# Patient Record
Sex: Female | Born: 1945
Health system: Southern US, Community
[De-identification: ages and names within clinical notes are randomized; demographics above are authoritative.]

## PROBLEM LIST (undated history)

## (undated) DIAGNOSIS — N189 Chronic kidney disease, unspecified: Secondary | ICD-10-CM

## (undated) DIAGNOSIS — K219 Gastro-esophageal reflux disease without esophagitis: Secondary | ICD-10-CM

## (undated) DIAGNOSIS — Z9889 Other specified postprocedural states: Secondary | ICD-10-CM

## (undated) DIAGNOSIS — I10 Essential (primary) hypertension: Secondary | ICD-10-CM

## (undated) DIAGNOSIS — M1990 Unspecified osteoarthritis, unspecified site: Secondary | ICD-10-CM

## (undated) DIAGNOSIS — R011 Cardiac murmur, unspecified: Secondary | ICD-10-CM

## (undated) DIAGNOSIS — R112 Nausea with vomiting, unspecified: Secondary | ICD-10-CM

## (undated) DIAGNOSIS — E785 Hyperlipidemia, unspecified: Secondary | ICD-10-CM

## (undated) HISTORY — PX: TONSILLECTOMY: SUR1361

## (undated) HISTORY — DX: Cardiac murmur, unspecified: R01.1

## (undated) HISTORY — DX: Unspecified osteoarthritis, unspecified site: M19.90

## (undated) HISTORY — DX: Hyperlipidemia, unspecified: E78.5

## (undated) HISTORY — PX: COLONOSCOPY: SHX174

## (undated) HISTORY — PX: ABDOMINAL HYSTERECTOMY: SHX81

## (undated) HISTORY — PX: DILATION AND CURETTAGE OF UTERUS: SHX78

## (undated) HISTORY — PX: UPPER GI ENDOSCOPY: SHX6162

---

## 1997-08-25 ENCOUNTER — Ambulatory Visit: Admission: RE | Admit: 1997-08-25 | Discharge: 1997-08-25 | Payer: Self-pay | Admitting: *Deleted

## 1997-08-25 ENCOUNTER — Ambulatory Visit (HOSPITAL_COMMUNITY): Admission: RE | Admit: 1997-08-25 | Discharge: 1997-08-25 | Payer: Self-pay | Admitting: *Deleted

## 1999-09-15 ENCOUNTER — Ambulatory Visit (HOSPITAL_COMMUNITY): Admission: RE | Admit: 1999-09-15 | Discharge: 1999-09-15 | Payer: Self-pay | Admitting: Family Medicine

## 2000-03-26 ENCOUNTER — Encounter: Payer: Self-pay | Admitting: General Surgery

## 2000-03-26 ENCOUNTER — Encounter: Admission: RE | Admit: 2000-03-26 | Discharge: 2000-03-26 | Payer: Self-pay | Admitting: General Surgery

## 2000-03-26 ENCOUNTER — Ambulatory Visit (HOSPITAL_COMMUNITY): Admission: RE | Admit: 2000-03-26 | Discharge: 2000-03-26 | Payer: Self-pay | Admitting: General Surgery

## 2000-09-17 ENCOUNTER — Encounter: Payer: Self-pay | Admitting: Urology

## 2000-09-17 ENCOUNTER — Ambulatory Visit (HOSPITAL_COMMUNITY): Admission: RE | Admit: 2000-09-17 | Discharge: 2000-09-17 | Payer: Self-pay | Admitting: Urology

## 2010-03-23 ENCOUNTER — Encounter: Admission: RE | Admit: 2010-03-23 | Discharge: 2010-03-23 | Payer: Self-pay | Admitting: Gastroenterology

## 2010-10-12 NOTE — Op Note (Signed)
[facility]  Patient:    Elizabeth Garrett, Elizabeth Garrett                   MRN: [id number] Proc. Date: 09/17/00 Adm. Date:  [id number] Attending:  Katherine Roan                           Operative Report  PREOPERATIVE DIAGNOSIS:  Large distal right ureteral stone.  POSTOPERATIVE DIAGNOSIS:  Large distal right ureteral stone.  OPERATION:  Ureteroscopy stone extraction with insertion of stent.  SURGEON:  Rozanna Boer., M.D.  ANESTHESIA:  General.  BRIEF HISTORY:  This 65 year old white female from [place] entered with acute right flank pain since September 14, 2000.  She had an 8 mm stone over the sacrum at that time, first seen on September 14, 2000, in the emergency room.  She has had two previous stones, one in 1997 that was removed by cystoscopic techniques in [place], and then another one passed spontaneously in 1995.  I saw her in the office and gave her a shot of Toradol, gave her some pain medicine which she not used in the last 36 hours.  KUB shows that the stone has now moved down to the distal right ureter just a few centimeters from the UV junction.  The patient enters now for cystoscopic removal of this large stone which is not likely to pass.  DESCRIPTION OF PROCEDURE:  The patient was placed on the operating table in the dorsolithotomy position after satisfactory induction of general endotracheal anesthesia. She was prepped and draped with Betadine in the usual sterile fashion.  The #21 Panendoscope was inserted, the bladder drained.  The bladder was smooth, not trabeculated, and no bladder mucosal lesions.  The right orifice was catheterized with a 0.38 floppy-tipped guidewire which, under fluoroscopy, was passed beyond the stone without difficulty.  Over the guidewire, a 4 cm ureteral balloon dilator was inserted and inflated to 14 atmospheres for 5 timed minutes.  The balloon dilator was then removed, and the guidewire was left for  a safety wire.  A 6 short ureteroscope was then passed up to and beyond the stone, and the stone was grasped with a four-wire basket and removed intact.  It was little tight coming through the orifice, but it came through intact.  A 6-French 24 cm length JJ ureteral stent was then passed over the guidewire under fluoroscopy.  As the guidewire was removed, the stent was in good position, the bladder drained.  A string was left tied to the distal end which was brought out through the urethra and taped to her lower abdomen.  She was given a shot of Toradol and was taken to the recovery room in good condition.  She will later be discharged as an outpatient.DD:  09/17/00 TD:  09/18/00 Job: [id number]

## 2013-02-18 ENCOUNTER — Other Ambulatory Visit: Payer: Self-pay | Admitting: Gastroenterology

## 2013-02-18 DIAGNOSIS — R112 Nausea with vomiting, unspecified: Secondary | ICD-10-CM

## 2013-03-02 ENCOUNTER — Ambulatory Visit
Admission: RE | Admit: 2013-03-02 | Discharge: 2013-03-02 | Disposition: A | Discharge status: Home / Self Care | Payer: Medicare Other | Source: Ambulatory Visit | Attending: Gastroenterology | Admitting: Gastroenterology

## 2013-03-02 DIAGNOSIS — R112 Nausea with vomiting, unspecified: Secondary | ICD-10-CM

## 2013-04-09 ENCOUNTER — Ambulatory Visit (INDEPENDENT_AMBULATORY_CARE_PROVIDER_SITE_OTHER): Payer: Self-pay | Admitting: General Surgery

## 2013-04-09 ENCOUNTER — Encounter (INDEPENDENT_AMBULATORY_CARE_PROVIDER_SITE_OTHER): Payer: Self-pay | Admitting: General Surgery

## 2013-04-09 ENCOUNTER — Ambulatory Visit (INDEPENDENT_AMBULATORY_CARE_PROVIDER_SITE_OTHER): Payer: Medicare Other | Admitting: General Surgery

## 2013-04-09 ENCOUNTER — Encounter (INDEPENDENT_AMBULATORY_CARE_PROVIDER_SITE_OTHER): Payer: Self-pay

## 2013-04-09 VITALS — BP 118/60 | HR 64 | Temp 97.2°F | Resp 14 | Ht 63.5 in | Wt 150.8 lb

## 2013-04-09 DIAGNOSIS — K8020 Calculus of gallbladder without cholecystitis without obstruction: Secondary | ICD-10-CM | POA: Insufficient documentation

## 2013-04-09 NOTE — Progress Notes (Signed)
Patient ID: Elizabeth Garrett, female   DOB: 05/27/1945, 67 y.o.   MRN: [id number]  Chief Complaint  Patient presents with  . New Evaluation    eval GB    HPI Elizabeth Garrett is a 67 y.o. female.  We're asked to the patient in consultation by Dr. Ross to evaluate her for gallstones. The patient is a 67 her white female who has been experiencing episodes of nausea vomiting and diarrhea. This is been going on for the last year or 2. He symptoms occur about one to 2 times per month. She has experienced right upper quadrant pain as well as pain all over her abdomen with this. She has had upper and lower endoscopies which were negative. She had an ultrasound which did show stones in her gallbladder but no gallbladder wall thickening or ductal dilatation. She was also told that she has hemorrhoids but she denies any rectal pain or rectal bleeding  HPI  Past Medical History  Diagnosis Date  . Heart murmur   . Arthritis   . Hyperlipidemia     Past Surgical History  Procedure Laterality Date  . Abdominal hysterectomy      History reviewed. No pertinent family history.  Social History History  Substance Use Topics  . Smoking status: Never Smoker   . Smokeless tobacco: Never Used  . Alcohol Use: No    Allergies  Allergen Reactions  . Sulfa Antibiotics Hives    Current Outpatient Prescriptions  Medication Sig Dispense Refill  . Misc Natural Products (OSTEO BI-FLEX ADV DOUBLE ST PO) Take by mouth.      . Multiple Vitamins-Minerals (CENTRUM SILVER ADULT 50+ PO) Take by mouth.      . Vitamin D, Ergocalciferol, (DRISDOL) 50000 UNITS CAPS capsule Take 50,000 Units by mouth every 7 (seven) days.      . amitriptyline (ELAVIL) 10 MG tablet       . benazepril (LOTENSIN) 40 MG tablet       . diclofenac (VOLTAREN) 75 MG EC tablet       . estradiol (ESTRACE) 0.5 MG tablet       . hydrochlorothiazide (HYDRODIURIL) 25 MG tablet        No current facility-administered medications for this  visit.    Review of Systems Review of Systems  Constitutional: Negative.   HENT: Negative.   Eyes: Negative.   Respiratory: Negative.   Cardiovascular: Negative.   Gastrointestinal: Positive for nausea, vomiting, abdominal pain, diarrhea and abdominal distention.  Endocrine: Negative.   Genitourinary: Negative.   Musculoskeletal: Negative.   Skin: Negative.   Allergic/Immunologic: Negative.   Neurological: Negative.   Hematological: Negative.   Psychiatric/Behavioral: Negative.     Blood pressure 118/60, pulse 64, temperature 97.2 F (36.2 C), temperature source Temporal, resp. rate 14, height 5' 3.5" (1.613 m), weight 150 lb 12.8 oz (68.402 kg).  Physical Exam Physical Exam  Constitutional: She is oriented to person, place, and time. She appears well-developed and well-nourished.  HENT:  Head: Normocephalic and atraumatic.  Eyes: Conjunctivae and EOM are normal. Pupils are equal, round, and reactive to light.  Neck: Normal range of motion. Neck supple.  Cardiovascular: Normal rate, regular rhythm and normal heart sounds.   Pulmonary/Chest: Effort normal and breath sounds normal.  Abdominal: Soft. Bowel sounds are normal. She exhibits no mass. There is no tenderness.  Genitourinary:  On rectal exam she has a small soft floppy external hemorrhoidal skin tag anteriorly. She has good rectal tone. There is no palpable   mass on digital exam.  Musculoskeletal: Normal range of motion.  Neurological: She is alert and oriented to person, place, and time.  Skin: Skin is warm and dry.  Psychiatric: She has a normal mood and affect. Her behavior is normal.    Data Reviewed As above  Assessment    The patient appears to have symptomatic gallstones although some of her pain is atypical. I've discussed with her in detail the risks and benefits of the operation to remove the gallbladder as well as some of the technical aspects and she understands and wishes to proceed. She understands  that I cannot guarantee that all of her painful goal weight once the gallbladder is removed. I also do not think her hemorrhoid is causing her any significant symptoms and probably does not need surgical intervention     Plan    Plan for laparoscopic cholecystectomy with intraoperative cholangiogram        TOTH III,Mayrin Schmuck S 04/09/2013, 2:36 PM    

## 2013-04-13 ENCOUNTER — Ambulatory Visit (INDEPENDENT_AMBULATORY_CARE_PROVIDER_SITE_OTHER): Payer: Medicare Other | Admitting: General Surgery

## 2013-04-19 ENCOUNTER — Encounter (HOSPITAL_COMMUNITY): Payer: Self-pay | Admitting: Pharmacy Technician

## 2013-04-27 ENCOUNTER — Telehealth (INDEPENDENT_AMBULATORY_CARE_PROVIDER_SITE_OTHER): Payer: Self-pay | Admitting: General Surgery

## 2013-04-27 NOTE — Telephone Encounter (Signed)
Called pt back. ABX will be given morning of surgery through IV at the hospital.

## 2013-04-27 NOTE — Telephone Encounter (Signed)
Pt called to ask about need (or not) for pre-op antibiotics.  She stated Dr. Carolynne Edouard had verbally discussed this at her last OV, but not given a Rx nor was one called in to her pharmacy.  Please advise.

## 2013-04-28 ENCOUNTER — Encounter (HOSPITAL_COMMUNITY)
Admission: RE | Admit: 2013-04-28 | Discharge: 2013-04-28 | Disposition: A | Discharge status: Home / Self Care | Payer: Medicare Other | Source: Ambulatory Visit | Attending: General Surgery | Admitting: General Surgery

## 2013-04-28 ENCOUNTER — Other Ambulatory Visit (HOSPITAL_COMMUNITY): Payer: Self-pay | Admitting: *Deleted

## 2013-04-28 ENCOUNTER — Encounter (HOSPITAL_COMMUNITY): Payer: Self-pay

## 2013-04-28 DIAGNOSIS — Z01812 Encounter for preprocedural laboratory examination: Secondary | ICD-10-CM | POA: Insufficient documentation

## 2013-04-28 DIAGNOSIS — Z01818 Encounter for other preprocedural examination: Secondary | ICD-10-CM | POA: Insufficient documentation

## 2013-04-28 DIAGNOSIS — Z01810 Encounter for preprocedural cardiovascular examination: Secondary | ICD-10-CM | POA: Insufficient documentation

## 2013-04-28 HISTORY — DX: Essential (primary) hypertension: I10

## 2013-04-28 HISTORY — DX: Gastro-esophageal reflux disease without esophagitis: K21.9

## 2013-04-28 HISTORY — DX: Other specified postprocedural states: Z98.890

## 2013-04-28 HISTORY — DX: Chronic kidney disease, unspecified: N18.9

## 2013-04-28 HISTORY — DX: Nausea with vomiting, unspecified: R11.2

## 2013-04-28 LAB — COMPREHENSIVE METABOLIC PANEL
ALT: 21 U/L (ref 0–35)
AST: 21 U/L (ref 0–37)
Albumin: 3.5 g/dL (ref 3.5–5.2)
CO2: 28 mEq/L (ref 19–32)
Chloride: 102 mEq/L (ref 96–112)
Creatinine, Ser: 1.07 mg/dL (ref 0.50–1.10)
GFR calc non Af Amer: 53 mL/min — ABNORMAL LOW (ref >90)
Potassium: 4.3 mEq/L (ref 3.5–5.1)
Sodium: 140 mEq/L (ref 135–145)
Total Bilirubin: 0.2 mg/dL — ABNORMAL LOW (ref 0.3–1.2)
Total Protein: 7 g/dL (ref 6.0–8.3)

## 2013-04-28 LAB — CBC WITH DIFFERENTIAL/PLATELET
Basophils Absolute: 0 10*3/uL (ref 0.0–0.1)
Basophils Relative: 0 % (ref 0–1)
Eosinophils Relative: 3 % (ref 0–5)
Lymphocytes Relative: 31 % (ref 12–46)
Lymphs Abs: 2.8 10*3/uL (ref 0.7–4.0)
MCHC: 33.1 g/dL (ref 30.0–36.0)
MCV: 86 fL (ref 78.0–100.0)
Monocytes Absolute: 0.5 10*3/uL (ref 0.1–1.0)
Monocytes Relative: 5 % (ref 3–12)
Neutro Abs: 5.6 10*3/uL (ref 1.7–7.7)
Neutrophils Relative %: 61 % (ref 43–77)
Platelets: 315 10*3/uL (ref 150–400)
RDW: 14.4 % (ref 11.5–15.5)
WBC: 9.2 10*3/uL (ref 4.0–10.5)

## 2013-04-28 MED ORDER — CHLORHEXIDINE GLUCONATE 4 % EX LIQD: 1.0000 "application " | Freq: Once | CUTANEOUS | Status: DC

## 2013-04-28 NOTE — Pre-Procedure Instructions (Signed)
Elizabeth Garrett  04/28/2013   Your procedure is scheduled on:  Monday, May 03, 2013 at 12:00 PM.   Report to [facility] Entrance "A" at 10:00 AM.   Call this number if you have problems the morning of surgery: [phone]   Remember:   Do not eat food or drink liquids after midnight Sunday, 05/02/13.   Take these medicines the morning of surgery with A SIP OF WATER: estradiol (ESTRACE), Polyethyl Glycol-Propyl Glycol (SYSTANE OP) eye-drops if needed, acetaminophen (TYLENOL) - if needed.   Stop all Vitamins, Herbal Medications, Aspirin and NSAIDS - diclofenac (VOLTAREN) as of today.                Do not wear jewelry, make-up or nail polish.  Do not wear lotions, powders, or perfumes. You may wear deodorant.  Do not shave 48 hours prior to surgery.   Do not bring valuables to the hospital.  [facility] is not responsible                  for any belongings or valuables.               Contacts, dentures or bridgework may not be worn into surgery.  Leave suitcase in the car. After surgery it may be brought to your room.  For patients admitted to the hospital, discharge time is determined by your                treatment team.                Special Instructions: Shower using CHG 2 nights before surgery and the night before surgery.  If you shower the day of surgery use CHG.  Use special wash - you have one bottle of CHG for all showers.  You should use approximately 1/3 of the bottle for each shower.   Please read over the following fact sheets that you were given: Pain Booklet, Coughing and Deep Breathing and Surgical Site Infection Prevention

## 2013-04-29 NOTE — Progress Notes (Signed)
Anesthesia Chart Review:  Patient is a 67 year old female scheduled for laparoscopic cholecystectomy on 05/03/13 by Dr. Carolynne Edouard.  History includes non-smoker, post-operative N/V, HLD, GERD, HTN, heart murmur (not specified), nephrolithiasis, arthritis, tonsillectomy, hysterectomy. PCP is Dr. Vianne Bulls.  EKG on NSR, LVH, LAE, non-specificiST/T wave abnormality. Overall, I think it is stable when compared to prior EKG on 09/17/00.  Preoperative CXR and labs noted.  Further evaluation by her assigned anesthesiologist on the day of surgery.  If no acute changes then I would anticipate that she could proceed as planned.  Velna Ochs [facility] Short Stay Center/Anesthesiology Phone [phone] 04/29/2013 1:06 PM

## 2013-05-02 MED ORDER — CEFAZOLIN SODIUM-DEXTROSE 2-3 GM-% IV SOLR
2.0000 g | INTRAVENOUS | Status: AC
  Administered 2013-05-03: 2 g via INTRAVENOUS
  Filled 2013-05-02: qty 50

## 2013-05-03 ENCOUNTER — Ambulatory Visit (HOSPITAL_COMMUNITY): Payer: Medicare Other

## 2013-05-03 ENCOUNTER — Ambulatory Visit (HOSPITAL_COMMUNITY): Payer: Medicare Other | Admitting: Anesthesiology

## 2013-05-03 ENCOUNTER — Ambulatory Visit (HOSPITAL_COMMUNITY)
Admission: RE | Admit: 2013-05-03 | Discharge: 2013-05-03 | Disposition: A | Discharge status: Home / Self Care | Payer: Medicare Other | Source: Ambulatory Visit | Attending: General Surgery | Admitting: General Surgery

## 2013-05-03 ENCOUNTER — Encounter (HOSPITAL_COMMUNITY): Payer: Self-pay | Admitting: Anesthesiology

## 2013-05-03 ENCOUNTER — Encounter (HOSPITAL_COMMUNITY): Payer: Medicare Other | Admitting: Vascular Surgery

## 2013-05-03 ENCOUNTER — Encounter (HOSPITAL_COMMUNITY)
Admission: RE | Disposition: A | Discharge status: Home / Self Care | Payer: Self-pay | Source: Ambulatory Visit | Attending: General Surgery

## 2013-05-03 DIAGNOSIS — K219 Gastro-esophageal reflux disease without esophagitis: Secondary | ICD-10-CM | POA: Insufficient documentation

## 2013-05-03 DIAGNOSIS — K8010 Calculus of gallbladder with chronic cholecystitis without obstruction: Secondary | ICD-10-CM

## 2013-05-03 DIAGNOSIS — I10 Essential (primary) hypertension: Secondary | ICD-10-CM | POA: Insufficient documentation

## 2013-05-03 DIAGNOSIS — M129 Arthropathy, unspecified: Secondary | ICD-10-CM | POA: Insufficient documentation

## 2013-05-03 DIAGNOSIS — K8020 Calculus of gallbladder without cholecystitis without obstruction: Secondary | ICD-10-CM

## 2013-05-03 DIAGNOSIS — K824 Cholesterolosis of gallbladder: Secondary | ICD-10-CM

## 2013-05-03 HISTORY — PX: CHOLECYSTECTOMY: SHX55

## 2013-05-03 SURGERY — LAPAROSCOPIC CHOLECYSTECTOMY WITH INTRAOPERATIVE CHOLANGIOGRAM
Anesthesia: General | Site: Abdomen

## 2013-05-03 MED ORDER — DEXAMETHASONE SODIUM PHOSPHATE 4 MG/ML IJ SOLN
INTRAMUSCULAR | Status: DC | PRN
  Administered 2013-05-03: 8 mg via INTRAVENOUS

## 2013-05-03 MED ORDER — BUPIVACAINE-EPINEPHRINE 0.25% -1:200000 IJ SOLN
INTRAMUSCULAR | Status: DC | PRN
  Administered 2013-05-03: 25 mL

## 2013-05-03 MED ORDER — LABETALOL HCL 5 MG/ML IV SOLN
INTRAVENOUS | Status: DC | PRN
  Administered 2013-05-03 (×2): 10 mg via INTRAVENOUS

## 2013-05-03 MED ORDER — CHLORHEXIDINE GLUCONATE 4 % EX LIQD: 1.0000 "application " | Freq: Once | CUTANEOUS | Status: DC

## 2013-05-03 MED ORDER — HYDROMORPHONE HCL PF 1 MG/ML IJ SOLN
INTRAMUSCULAR | Status: AC
  Filled 2013-05-03: qty 1

## 2013-05-03 MED ORDER — PROPOFOL 10 MG/ML IV BOLUS
INTRAVENOUS | Status: DC | PRN
  Administered 2013-05-03: 150 mg via INTRAVENOUS

## 2013-05-03 MED ORDER — PROMETHAZINE HCL 25 MG/ML IJ SOLN
6.2500 mg | INTRAMUSCULAR | Status: DC | PRN
  Administered 2013-05-03: 6.25 mg via INTRAVENOUS

## 2013-05-03 MED ORDER — MIDAZOLAM HCL 5 MG/5ML IJ SOLN
INTRAMUSCULAR | Status: DC | PRN
  Administered 2013-05-03: 1 mg via INTRAVENOUS

## 2013-05-03 MED ORDER — HYDROMORPHONE HCL PF 1 MG/ML IJ SOLN
0.2500 mg | INTRAMUSCULAR | Status: DC | PRN
  Administered 2013-05-03: 0.5 mg via INTRAVENOUS
  Administered 2013-05-03 (×2): 0.25 mg via INTRAVENOUS

## 2013-05-03 MED ORDER — LIDOCAINE HCL (CARDIAC) 20 MG/ML IV SOLN
INTRAVENOUS | Status: DC | PRN
  Administered 2013-05-03: 80 mg via INTRAVENOUS

## 2013-05-03 MED ORDER — ONDANSETRON HCL 4 MG/2ML IJ SOLN
INTRAMUSCULAR | Status: DC | PRN
  Administered 2013-05-03: 4 mg via INTRAVENOUS

## 2013-05-03 MED ORDER — OXYCODONE HCL 5 MG PO TABS: 5.0000 mg | ORAL_TABLET | Freq: Once | ORAL | Status: DC | PRN

## 2013-05-03 MED ORDER — ROCURONIUM BROMIDE 100 MG/10ML IV SOLN
INTRAVENOUS | Status: DC | PRN
  Administered 2013-05-03: 50 mg via INTRAVENOUS

## 2013-05-03 MED ORDER — SODIUM CHLORIDE 0.9 % IR SOLN
Status: DC | PRN
  Administered 2013-05-03: 1000 mL

## 2013-05-03 MED ORDER — SODIUM CHLORIDE 0.9 % IV SOLN
INTRAVENOUS | Status: DC | PRN
  Administered 2013-05-03: 12:00:00

## 2013-05-03 MED ORDER — NEOSTIGMINE METHYLSULFATE 1 MG/ML IJ SOLN
INTRAMUSCULAR | Status: DC | PRN
  Administered 2013-05-03: 3 mg via INTRAVENOUS

## 2013-05-03 MED ORDER — OXYCODONE-ACETAMINOPHEN 5-325 MG PO TABS: 1.0000 | ORAL_TABLET | ORAL | Status: DC | PRN

## 2013-05-03 MED ORDER — GLYCOPYRROLATE 0.2 MG/ML IJ SOLN
INTRAMUSCULAR | Status: DC | PRN
  Administered 2013-05-03: 0.4 mg via INTRAVENOUS

## 2013-05-03 MED ORDER — LACTATED RINGERS IV SOLN
INTRAVENOUS | Status: DC | PRN
  Administered 2013-05-03 (×2): via INTRAVENOUS

## 2013-05-03 MED ORDER — OXYCODONE HCL 5 MG/5ML PO SOLN: 5.0000 mg | Freq: Once | ORAL | Status: DC | PRN

## 2013-05-03 MED ORDER — PROMETHAZINE HCL 25 MG/ML IJ SOLN
INTRAMUSCULAR | Status: AC
  Filled 2013-05-03: qty 1

## 2013-05-03 MED ORDER — LACTATED RINGERS IV SOLN
INTRAVENOUS | Status: DC
  Administered 2013-05-03: 10:00:00 via INTRAVENOUS

## 2013-05-03 MED ORDER — FENTANYL CITRATE 0.05 MG/ML IJ SOLN
INTRAMUSCULAR | Status: DC | PRN
  Administered 2013-05-03: 100 ug via INTRAVENOUS
  Administered 2013-05-03 (×3): 50 ug via INTRAVENOUS

## 2013-05-03 SURGICAL SUPPLY — 40 items
ADH SKN CLS APL DERMABOND .7 (GAUZE/BANDAGES/DRESSINGS) ×1
APPLIER CLIP ROT 10 11.4 M/L (STAPLE) ×2
APR CLP MED LRG 11.4X10 (STAPLE) ×1
BAG SPEC RTRVL LRG 6X4 10 (ENDOMECHANICALS) ×1
BLADE SURG ROTATE 9660 (MISCELLANEOUS) IMPLANT
CANISTER SUCTION 2500CC (MISCELLANEOUS) ×2 IMPLANT
CATH REDDICK CHOLANGI 4FR 50CM (CATHETERS) ×2 IMPLANT
CHLORAPREP W/TINT 26ML (MISCELLANEOUS) ×2 IMPLANT
CLIP APPLIE ROT 10 11.4 M/L (STAPLE) ×1 IMPLANT
COVER MAYO STAND STRL (DRAPES) ×2 IMPLANT
COVER SURGICAL LIGHT HANDLE (MISCELLANEOUS) ×2 IMPLANT
DECANTER SPIKE VIAL GLASS SM (MISCELLANEOUS) ×4 IMPLANT
DERMABOND ADVANCED (GAUZE/BANDAGES/DRESSINGS) ×1
DERMABOND ADVANCED .7 DNX12 (GAUZE/BANDAGES/DRESSINGS) ×1 IMPLANT
DRAPE C-ARM 42X72 X-RAY (DRAPES) ×2 IMPLANT
DRAPE UTILITY 15X26 W/TAPE STR (DRAPE) ×4 IMPLANT
ELECT REM PT RETURN 9FT ADLT (ELECTROSURGICAL) ×2
ELECTRODE REM PT RTRN 9FT ADLT (ELECTROSURGICAL) ×1 IMPLANT
GLOVE BIO SURGEON STRL SZ7 (GLOVE) ×1 IMPLANT
GLOVE BIO SURGEON STRL SZ7.5 (GLOVE) ×3 IMPLANT
GLOVE BIOGEL PI IND STRL 8 (GLOVE) IMPLANT
GLOVE BIOGEL PI INDICATOR 8 (GLOVE) ×1
GOWN STRL NON-REIN LRG LVL3 (GOWN DISPOSABLE) ×8 IMPLANT
IV CATH 14GX2 1/4 (CATHETERS) ×2 IMPLANT
KIT BASIN OR (CUSTOM PROCEDURE TRAY) ×2 IMPLANT
KIT ROOM TURNOVER OR (KITS) ×2 IMPLANT
NS IRRIG 1000ML POUR BTL (IV SOLUTION) ×2 IMPLANT
PAD ARMBOARD 7.5X6 YLW CONV (MISCELLANEOUS) ×2 IMPLANT
POUCH SPECIMEN RETRIEVAL 10MM (ENDOMECHANICALS) ×2 IMPLANT
SCISSORS LAP 5X35 DISP (ENDOMECHANICALS) ×2 IMPLANT
SET IRRIG TUBING LAPAROSCOPIC (IRRIGATION / IRRIGATOR) ×2 IMPLANT
SLEEVE ENDOPATH XCEL 5M (ENDOMECHANICALS) ×2 IMPLANT
SPECIMEN JAR SMALL (MISCELLANEOUS) ×2 IMPLANT
SUT MNCRL AB 4-0 PS2 18 (SUTURE) ×2 IMPLANT
TOWEL OR 17X24 6PK STRL BLUE (TOWEL DISPOSABLE) ×2 IMPLANT
TOWEL OR 17X26 10 PK STRL BLUE (TOWEL DISPOSABLE) ×2 IMPLANT
TRAY LAPAROSCOPIC (CUSTOM PROCEDURE TRAY) ×2 IMPLANT
TROCAR XCEL BLUNT TIP 100MML (ENDOMECHANICALS) ×2 IMPLANT
TROCAR XCEL NON-BLD 11X100MML (ENDOMECHANICALS) ×2 IMPLANT
TROCAR XCEL NON-BLD 5MMX100MML (ENDOMECHANICALS) ×2 IMPLANT

## 2013-05-03 NOTE — Op Note (Addendum)
05/03/2013  12:16 PM  PATIENT:  Elizabeth Garrett  67 y.o. female  PRE-OPERATIVE DIAGNOSIS:  Gallstones  POST-OPERATIVE DIAGNOSIS:  Gallstones  PROCEDURE:  Procedure(s): LAPAROSCOPIC CHOLECYSTECTOMY WITH INTRAOPERATIVE CHOLANGIOGRAM (N/A)  SURGEON:  Surgeon(s) and Role:    * Robyne Askew, MD - Primary    * Axel Filler, MD - Assisting  PHYSICIAN ASSISTANT:   ASSISTANTS: Dr Derrell Lolling   ANESTHESIA:   general  EBL:  Total I/O In: 1000 [I.V.:1000] Out: -   BLOOD ADMINISTERED:none  DRAINS: none   LOCAL MEDICATIONS USED:  MARCAINE     SPECIMEN:  Source of Specimen:  gallbladder  DISPOSITION OF SPECIMEN:  PATHOLOGY  COUNTS:  YES  TOURNIQUET:  * No tourniquets in log *  DICTATION: .Dragon Dictation @opnoteheader @  Procedure: After informed consent was obtained the patient was brought to the operating room and placed in the supine position on the operating room table. After adequate induction of general anesthesia the patient's abdomen was prepped with ChloraPrep allowed to dry and draped in usual sterile manner. The area below the umbilicus was infiltrated with quarter percent  Marcaine. A small incision was made with a 15 blade knife. The incision was carried down through the subcutaneous tissue bluntly with a hemostat and [employer]-Nav[employer] retractors. The linea alba was identified. The linea alba was incised with a 15 blade knife and each side was grasped with Coker clamps. The preperitoneal space was then probed with a hemostat until the peritoneum was opened and access was gained to the abdominal cavity. A 0 Vicryl pursestring stitch was placed in the fascia surrounding the opening. A Hassan cannula was then placed through the opening and anchored in place with the previously placed Vicryl purse string stitch. The abdomen was insufflated with carbon dioxide without difficulty. A laparoscope was inserted through the [facility] cannula in the right upper quadrant was inspected. Next the  epigastric region was infiltrated with % Marcaine. A small incision was made with a 15 blade knife. A 10 mm port was placed bluntly through this incision into the abdominal cavity under direct vision. Next 2 sites were chosen laterally on the right side of the abdomen for placement of 5 mm ports. Each of these areas was infiltrated with quarter percent Marcaine. Small stab incisions were made with a 15 blade knife. 5 mm ports were then placed bluntly through these incisions into the abdominal cavity under direct vision without difficulty. A blunt grasper was placed through the lateralmost 5 mm port and used to grasp the dome of the gallbladder and elevated anteriorly and superiorly. Another blunt grasper was placed through the other 5 mm port and used to retract the body and neck of the gallbladder. A dissector was placed through the epigastric port and using the electrocautery the peritoneal reflection at the gallbladder neck was opened. Blunt dissection was then carried out in this area until the gallbladder neck-cystic duct junction was readily identified and a good window was created. A single clip was placed on the gallbladder neck. A small  ductotomy was made just below the clip with laparoscopic scissors. A 14-gauge Angiocath was then placed through the anterior abdominal wall under direct vision. A Reddick cholangiogram catheter was then placed through the Angiocath and flushed. The catheter was then placed in the cystic duct and anchored in place with a clip. A cholangiogram was obtained that showed no filling defects good emptying into the duodenum and adequate length on the cystic duct. The right hepatic duct came off  the cystic duct and the left hepatic duct came off the common duct lower. The anchoring clip and catheters were then removed from the patient. 3 clips were placed proximally on the cystic duct and the duct was divided between the 2 sets of clips. Posterior to this the cystic artery was  identified and again dissected bluntly in a circumferential manner until a good window  was created. 2 clips were placed proximally and one distally on the artery and the artery was divided between the 2 sets of clips. Next a laparoscopic hook cautery device was used to separate the gallbladder from the liver bed. Prior to completely detaching the gallbladder from the liver bed the liver bed was inspected and several small bleeding points were coagulated with the electrocautery until the area was completely hemostatic. The gallbladder was then detached the rest of it from the liver bed without difficulty. A laparoscopic bag was inserted through the epigastric port. The gallbladder was placed within the bag and the bag was sealed. A laparoscope was then moved to the epigastric port. The gallbladder grasper was placed through the [facility] cannula and used to grasp the opening of the bag. The bag with the gallbladder was then removed with the [facility] cannula through the infraumbilical port without difficulty. The fascial defect was then closed with the previously placed Vicryl pursestring stitch as well as with another figure-of-eight 0 Vicryl stitch. The liver bed was inspected again and found to be hemostatic. The abdomen was irrigated with copious amounts of saline until the effluent was clear. The ports were then removed under direct vision without difficulty and were found to be hemostatic. The gas was allowed to escape. The skin incisions were all closed with interrupted 4-0 Monocryl subcuticular stitches. Dermabond dressings were applied. The patient tolerated the procedure well. At the end of the case all needle sponge and instrument counts were correct. The patient was then awakened and taken to recovery in stable condition  PLAN OF CARE: Discharge to home after PACU  PATIENT DISPOSITION:  PACU - hemodynamically stable.   Delay start of Pharmacological VTE agent (>24hrs) due to surgical blood loss or risk  of bleeding: not applicable

## 2013-05-03 NOTE — Interval H&P Note (Signed)
History and Physical Interval Note:  05/03/2013 10:31 AM  Elizabeth Garrett  has presented today for surgery, with the diagnosis of gallstone  The various methods of treatment have been discussed with the patient and family. After consideration of risks, benefits and other options for treatment, the patient has consented to  Procedure(s): LAPAROSCOPIC CHOLECYSTECTOMY WITH INTRAOPERATIVE CHOLANGIOGRAM (N/A) as a surgical intervention .  The patient's history has been reviewed, patient examined, no change in status, stable for surgery.  I have reviewed the patient's chart and labs.  Questions were answered to the patient's satisfaction.     TOTH III,Lynsee Wands S

## 2013-05-03 NOTE — H&P (View-Only) (Signed)
Patient ID: Elizabeth Garrett, female   DOB: [birth year], 67 y.o.   MRN: [id number]  Chief Complaint  Patient presents with  . New Evaluation    eval GB    HPI Elizabeth Garrett is a 67 y.o. female.  We're asked to the patient in consultation by Dr. Tenny Craw to evaluate her for gallstones. The patient is a 55 her white female who has been experiencing episodes of nausea vomiting and diarrhea. This is been going on for the last year or 2. He symptoms occur about one to 2 times per month. She has experienced right upper quadrant pain as well as pain all over her abdomen with this. She has had upper and lower endoscopies which were negative. She had an ultrasound which did show stones in her gallbladder but no gallbladder wall thickening or ductal dilatation. She was also told that she has hemorrhoids but she denies any rectal pain or rectal bleeding  HPI  Past Medical History  Diagnosis Date  . Heart murmur   . Arthritis   . Hyperlipidemia     Past Surgical History  Procedure Laterality Date  . Abdominal hysterectomy      History reviewed. No pertinent family history.  Social History History  Substance Use Topics  . Smoking status: Never Smoker   . Smokeless tobacco: Never Used  . Alcohol Use: No    Allergies  Allergen Reactions  . Sulfa Antibiotics Hives    Current Outpatient Prescriptions  Medication Sig Dispense Refill  . Misc Natural Products (OSTEO BI-FLEX ADV DOUBLE ST PO) Take by mouth.      . Multiple Vitamins-Minerals (CENTRUM SILVER ADULT 50+ PO) Take by mouth.      . Vitamin D, Ergocalciferol, (DRISDOL) 50000 UNITS CAPS capsule Take 50,000 Units by mouth every 7 (seven) days.      Marland Kitchen amitriptyline (ELAVIL) 10 MG tablet       . benazepril (LOTENSIN) 40 MG tablet       . diclofenac (VOLTAREN) 75 MG EC tablet       . estradiol (ESTRACE) 0.5 MG tablet       . hydrochlorothiazide (HYDRODIURIL) 25 MG tablet        No current facility-administered medications for this  visit.    Review of Systems Review of Systems  Constitutional: Negative.   HENT: Negative.   Eyes: Negative.   Respiratory: Negative.   Cardiovascular: Negative.   Gastrointestinal: Positive for nausea, vomiting, abdominal pain, diarrhea and abdominal distention.  Endocrine: Negative.   Genitourinary: Negative.   Musculoskeletal: Negative.   Skin: Negative.   Allergic/Immunologic: Negative.   Neurological: Negative.   Hematological: Negative.   Psychiatric/Behavioral: Negative.     Blood pressure 118/60, pulse 64, temperature 97.2 F (36.2 C), temperature source Temporal, resp. rate 14, height 5' 3.5" (1.613 m), weight 150 lb 12.8 oz (68.402 kg).  Physical Exam Physical Exam  Constitutional: She is oriented to person, place, and time. She appears well-developed and well-nourished.  HENT:  Head: Normocephalic and atraumatic.  Eyes: Conjunctivae and EOM are normal. Pupils are equal, round, and reactive to light.  Neck: Normal range of motion. Neck supple.  Cardiovascular: Normal rate, regular rhythm and normal heart sounds.   Pulmonary/Chest: Effort normal and breath sounds normal.  Abdominal: Soft. Bowel sounds are normal. She exhibits no mass. There is no tenderness.  Genitourinary:  On rectal exam she has a small soft floppy external hemorrhoidal skin tag anteriorly. She has good rectal tone. There is no palpable  mass on digital exam.  Musculoskeletal: Normal range of motion.  Neurological: She is alert and oriented to person, place, and time.  Skin: Skin is warm and dry.  Psychiatric: She has a normal mood and affect. Her behavior is normal.    Data Reviewed As above  Assessment    The patient appears to have symptomatic gallstones although some of her pain is atypical. I've discussed with her in detail the risks and benefits of the operation to remove the gallbladder as well as some of the technical aspects and she understands and wishes to proceed. She understands  that I cannot guarantee that all of her painful goal weight once the gallbladder is removed. I also do not think her hemorrhoid is causing her any significant symptoms and probably does not need surgical intervention     Plan    Plan for laparoscopic cholecystectomy with intraoperative cholangiogram        TOTH III,PAUL S 04/09/2013, 2:36 PM

## 2013-05-03 NOTE — Anesthesia Postprocedure Evaluation (Signed)
  Anesthesia Post-op Note  Patient: Elizabeth Garrett  Procedure(s) Performed: Procedure(s): LAPAROSCOPIC CHOLECYSTECTOMY WITH INTRAOPERATIVE CHOLANGIOGRAM (N/A)  Patient Location: PACU  Anesthesia Type:General  Level of Consciousness: awake  Airway and Oxygen Therapy: Patient Spontanous Breathing  Post-op Pain: mild  Post-op Assessment: Post-op Vital signs reviewed  Post-op Vital Signs: stable  Complications: No apparent anesthesia complications

## 2013-05-03 NOTE — Transfer of Care (Signed)
Immediate Anesthesia Transfer of Care Note  Patient: Elizabeth Garrett  Procedure(s) Performed: Procedure(s): LAPAROSCOPIC CHOLECYSTECTOMY WITH INTRAOPERATIVE CHOLANGIOGRAM (N/A)  Patient Location: PACU  Anesthesia Type:General  Level of Consciousness: awake, alert , oriented and patient cooperative  Airway & Oxygen Therapy: Patient Spontanous Breathing and Patient connected to nasal cannula oxygen  Post-op Assessment: Report given to PACU RN, Post -op Vital signs reviewed and stable and Patient moving all extremities  Post vital signs: Reviewed and stable  Complications: No apparent anesthesia complications

## 2013-05-03 NOTE — Anesthesia Preprocedure Evaluation (Signed)
Anesthesia Evaluation  Patient identified by MRN, date of birth, ID band Patient awake    Reviewed: Allergy & Precautions, H&P , NPO status   History of Anesthesia Complications (+) PONV  Airway Mallampati: I  Neck ROM: Full    Dental  (+) Teeth Intact   Pulmonary neg pulmonary ROS,  breath sounds clear to auscultation        Cardiovascular hypertension, Rhythm:Regular Rate:Normal     Neuro/Psych negative neurological ROS     GI/Hepatic Neg liver ROS, GERD-  ,  Endo/Other    Renal/GU      Musculoskeletal  (+) Arthritis -,   Abdominal   Peds  Hematology negative hematology ROS (+)   Anesthesia Other Findings   Reproductive/Obstetrics                           Anesthesia Physical Anesthesia Plan  ASA: II  Anesthesia Plan: General   Post-op Pain Management:    Induction: Intravenous  Airway Management Planned: Oral ETT  Additional Equipment:   Intra-op Plan:   Post-operative Plan: Extubation in OR  Informed Consent:   Plan Discussed with: CRNA and Surgeon  Anesthesia Plan Comments:         Anesthesia Quick Evaluation

## 2013-05-04 ENCOUNTER — Encounter (HOSPITAL_COMMUNITY): Payer: Self-pay | Admitting: General Surgery

## 2013-05-18 ENCOUNTER — Encounter (INDEPENDENT_AMBULATORY_CARE_PROVIDER_SITE_OTHER): Payer: Self-pay

## 2013-05-18 ENCOUNTER — Ambulatory Visit (INDEPENDENT_AMBULATORY_CARE_PROVIDER_SITE_OTHER): Payer: Medicare Other | Admitting: General Surgery

## 2013-05-18 ENCOUNTER — Encounter (INDEPENDENT_AMBULATORY_CARE_PROVIDER_SITE_OTHER): Payer: Self-pay | Admitting: General Surgery

## 2013-05-18 VITALS — BP 120/72 | HR 68 | Temp 98.4°F | Resp 14 | Ht 63.5 in | Wt 149.4 lb

## 2013-05-18 DIAGNOSIS — K8020 Calculus of gallbladder without cholecystitis without obstruction: Secondary | ICD-10-CM

## 2013-05-18 NOTE — Progress Notes (Signed)
Subjective:     Patient ID: Boyce Medici, female   DOB: December 15, 1945, 67 y.o.   MRN: [id number]  HPI The patient is a 67 year old white female who is 2 weeks status post Laparoscopic cholecystectomy for cholecystitis with cholelithiasis. She has done very well since the surgery. She has not required any pain medicine. Her appetite is good no bowels are working normally.  Review of Systems     Objective:   Physical Exam On exam her abdomen is soft and nontender. Her incisions are healing nicely with no sign of infection    Assessment:     The patient is 2 weeks status post laparoscopic cholecystectomy     Plan:     At this point she may return to her normal routine. I would ask her to refrain from any heavy lifting for about 3 more weeks. We will plan to see her back on a when necessary basis

## 2013-05-18 NOTE — Patient Instructions (Signed)
No heavy lifting for another 3 weeks 

## 2013-09-07 ENCOUNTER — Encounter (INDEPENDENT_AMBULATORY_CARE_PROVIDER_SITE_OTHER): Payer: Medicare Other | Admitting: General Surgery

## 2013-10-05 ENCOUNTER — Ambulatory Visit (INDEPENDENT_AMBULATORY_CARE_PROVIDER_SITE_OTHER): Payer: Medicare Other | Admitting: General Surgery

## 2013-10-05 ENCOUNTER — Encounter (INDEPENDENT_AMBULATORY_CARE_PROVIDER_SITE_OTHER): Payer: Self-pay | Admitting: General Surgery

## 2013-10-05 VITALS — BP 124/78 | HR 68 | Temp 97.4°F | Ht 63.0 in | Wt 149.0 lb

## 2013-10-05 DIAGNOSIS — R197 Diarrhea, unspecified: Secondary | ICD-10-CM | POA: Insufficient documentation

## 2013-10-05 MED ORDER — CHOLESTYRAMINE LIGHT 4 G PO PACK: 4.0000 g | PACK | Freq: Two times a day (BID) | ORAL | Status: AC

## 2013-10-05 NOTE — Patient Instructions (Signed)
Start taking probiotic everyday Return to Dr. Randa EvensEdwards

## 2013-10-05 NOTE — Progress Notes (Signed)
Subjective:     Patient ID: Elizabeth Garrett, female   DOB: [birth year], 68 y.o.   MRN: [id number]  HPI The patient is a 68 year old white female who is 5 months status post laparoscopic cholecystectomy. She initially did well until February when she started developing intermittent nausea vomiting and diarrhea. The symptoms did not occur every day but tends to come and go. The diarrhea seems to be happening fairly frequently. She denies any fevers or chills. She does get some abdominal discomfort when she feels bloated.  Review of Systems  Constitutional: Negative.   HENT: Negative.   Eyes: Negative.   Respiratory: Negative.   Cardiovascular: Negative.   Gastrointestinal: Positive for nausea, vomiting, abdominal pain, diarrhea and abdominal distention.  Endocrine: Negative.   Genitourinary: Negative.   Musculoskeletal: Negative.   Skin: Negative.   Allergic/Immunologic: Negative.   Neurological: Negative.   Hematological: Negative.   Psychiatric/Behavioral: Negative.        Objective:   Physical Exam  Constitutional: She is oriented to person, place, and time. She appears well-developed and well-nourished.  HENT:  Head: Normocephalic and atraumatic.  Eyes: Conjunctivae and EOM are normal. Pupils are equal, round, and reactive to light.  Neck: Normal range of motion. Neck supple.  Cardiovascular: Normal rate, regular rhythm and normal heart sounds.   Pulmonary/Chest: Effort normal and breath sounds normal.  Abdominal: Soft. Bowel sounds are normal.  The abdomen is soft and nontender today. Her incisions are healing nicely with no sign of infection.  Musculoskeletal: Normal range of motion.  Lymphadenopathy:    She has no cervical adenopathy.  Neurological: She is alert and oriented to person, place, and time.  Skin: Skin is warm and dry.  Psychiatric: She has a normal mood and affect. Her behavior is normal.       Assessment:     The patient is having intermittent nausea  vomiting and diarrhea now 5 months after laparoscopic cholecystectomy. Since her symptoms are so remote from the timing of her operation I think it is less likely that her symptoms are related to the surgery. If her symptoms are related to the surgery then I will plan to treat her with Questran and probiotics. I will plan to send her stool for C. Difficile and I will also refer her back to her gastroenterologist for further evaluation     Plan:     Plan to have her return to Dr. Randa EvensEdwards and start Questran twice a day. I will plan to see her back in one month to check her progress

## 2013-10-06 ENCOUNTER — Other Ambulatory Visit (INDEPENDENT_AMBULATORY_CARE_PROVIDER_SITE_OTHER): Payer: Self-pay | Admitting: General Surgery

## 2013-10-06 ENCOUNTER — Telehealth (INDEPENDENT_AMBULATORY_CARE_PROVIDER_SITE_OTHER): Payer: Self-pay | Admitting: *Deleted

## 2013-10-06 NOTE — Telephone Encounter (Signed)
LM for pt to return my call re:  appt information.  Please inform pt of her appt with Dr. Randa EvensEdwards @ [facility], 5.14.15 @ 8:15. If pt needs to reschedule please have her call [phone].  Thanks!  Victorino DikeJennifer

## 2013-10-07 LAB — C. DIFFICILE GDH AND TOXIN A/B
C. DIFF TOXIN A/B: NOT DETECTED
C. difficile GDH: NOT DETECTED

## 2013-10-07 NOTE — Telephone Encounter (Signed)
FYI: Pt did not show for the appt with Dr Randa EvensEdwards today.

## 2013-10-08 ENCOUNTER — Telehealth (INDEPENDENT_AMBULATORY_CARE_PROVIDER_SITE_OTHER): Payer: Self-pay

## 2013-10-08 NOTE — Telephone Encounter (Signed)
Pt made aware test negative for c-diff.  She has a f/u with Dr. Carolynne Edouardoth on 11/05/13.

## 2013-11-05 ENCOUNTER — Ambulatory Visit (INDEPENDENT_AMBULATORY_CARE_PROVIDER_SITE_OTHER): Payer: Medicare Other | Admitting: General Surgery

## 2013-11-05 ENCOUNTER — Encounter (INDEPENDENT_AMBULATORY_CARE_PROVIDER_SITE_OTHER): Payer: Self-pay | Admitting: General Surgery

## 2013-11-05 VITALS — BP 124/80 | HR 100 | Temp 97.4°F | Ht 63.0 in | Wt 150.0 lb

## 2013-11-05 DIAGNOSIS — K8020 Calculus of gallbladder without cholecystitis without obstruction: Secondary | ICD-10-CM

## 2013-11-05 DIAGNOSIS — R197 Diarrhea, unspecified: Secondary | ICD-10-CM

## 2013-11-05 NOTE — Patient Instructions (Signed)
Continue questran and follow up with Dr. Randa EvensEdwards

## 2013-11-05 NOTE — Progress Notes (Signed)
Subjective:     Patient ID: Elizabeth Garrett, female   DOB: 16-Nov-1945, 68 y.o.   MRN: [id number]  HPI The patient is a 68 year old white female who is about 6 months status post laparoscopic cholecystectomy. A couple months after surgery she developed diarrhea. We placed her on Questran and she feels much better. She does not have diarrhea as long as she takes the Questran. She denies any abdominal pain. She has followed up with Dr. Randa EvensEdwards in gastroenterology  Review of Systems  Constitutional: Negative.   HENT: Negative.   Eyes: Negative.   Respiratory: Negative.   Cardiovascular: Negative.   Gastrointestinal: Negative.   Endocrine: Negative.   Genitourinary: Negative.   Musculoskeletal: Negative.   Skin: Negative.   Allergic/Immunologic: Negative.   Neurological: Negative.   Hematological: Negative.   Psychiatric/Behavioral: Negative.        Objective:   Physical Exam  Constitutional: She is oriented to person, place, and time. She appears well-developed and well-nourished.  HENT:  Head: Normocephalic and atraumatic.  Eyes: Conjunctivae and EOM are normal. Pupils are equal, round, and reactive to light.  Neck: Normal range of motion. Neck supple.  Cardiovascular: Normal rate, regular rhythm and normal heart sounds.   Pulmonary/Chest: Effort normal and breath sounds normal.  Abdominal: Soft. Bowel sounds are normal. There is no tenderness.  Musculoskeletal: Normal range of motion.  Lymphadenopathy:    She has no cervical adenopathy.  Neurological: She is alert and oriented to person, place, and time.  Skin: Skin is warm and dry.  Psychiatric: She has a normal mood and affect. Her behavior is normal.       Assessment:     The patient is 6 months status post laparoscopic cholecystectomy complicated by delayed onset of diarrhea after surgery     Plan:     At this point she will continue to take Questran every day. She will continue to followup with Dr. Randa EvensEdwards in  gastroenterology. I will plan to see her back on a when necessary basis

## 2015-02-10 IMAGING — US US ABDOMEN COMPLETE
1 series · 13 of 25 positions shown · non-contrast
Comparison: Report of ultrasound performed 03/26/2000.

CLINICAL DATA: Nausea. Vomiting. General abdominal pain. History of
cholelithiasis.

EXAM:
ULTRASOUND ABDOMEN COMPLETE

[Series 1: us abdomen complete · 0.22mm/px · 13 of 88 slices shown]
[im 1/88]
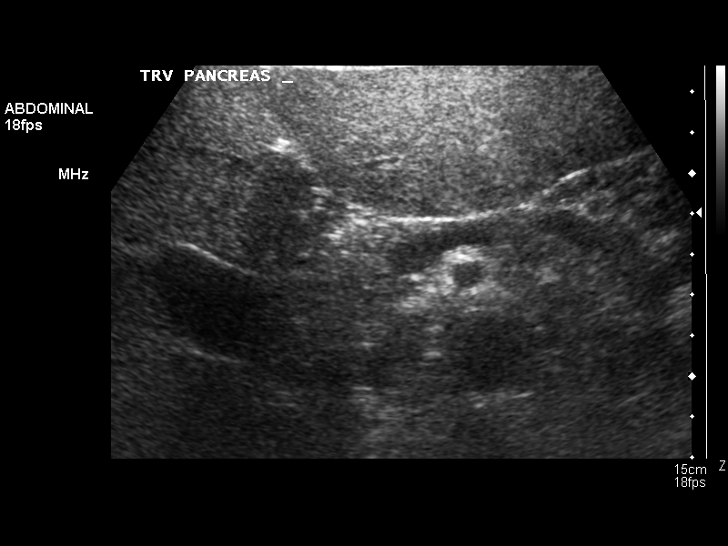
[im 8/88]
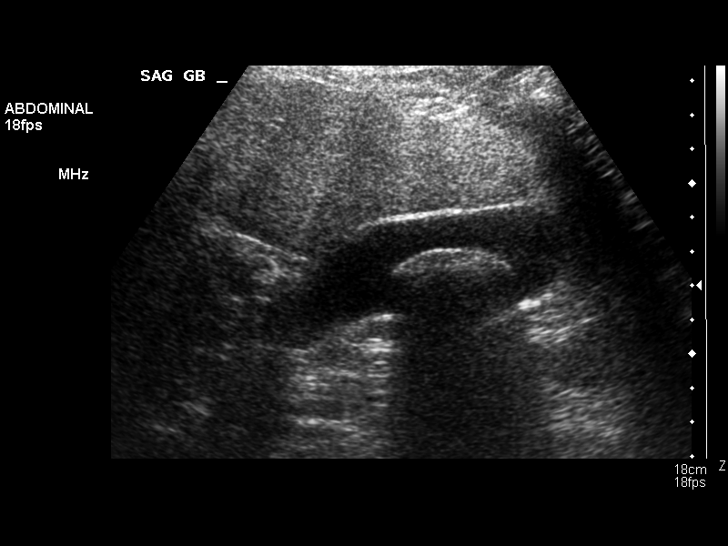
[im 15/88]
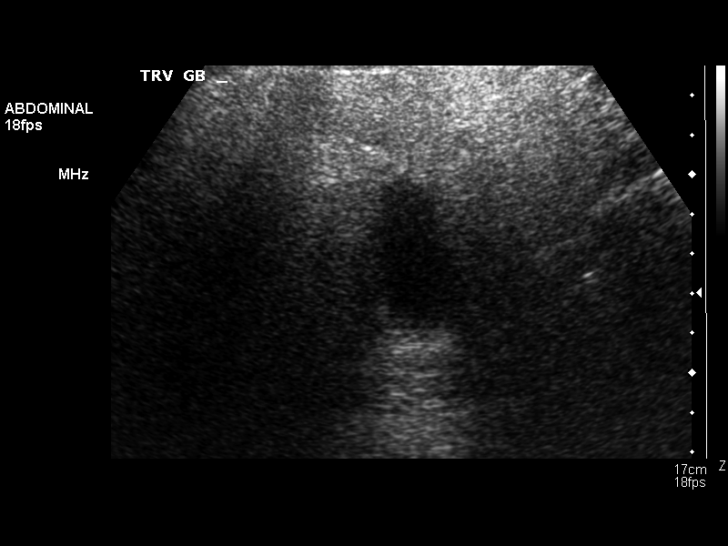
[im 22/88]
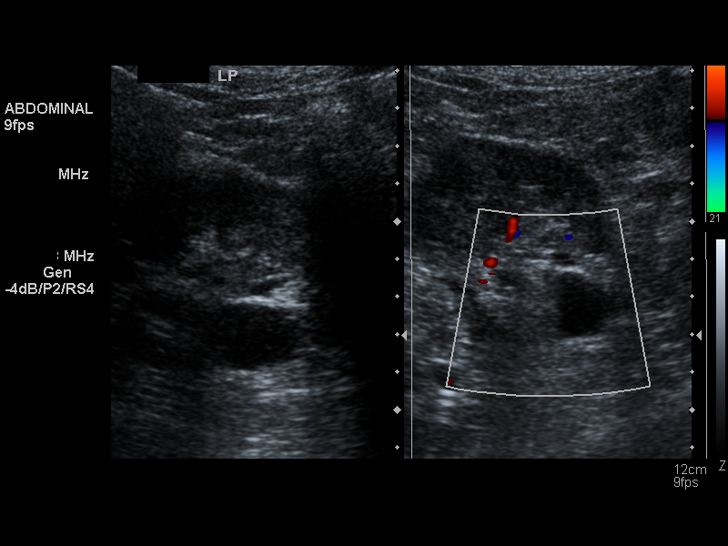
[im 30/88]
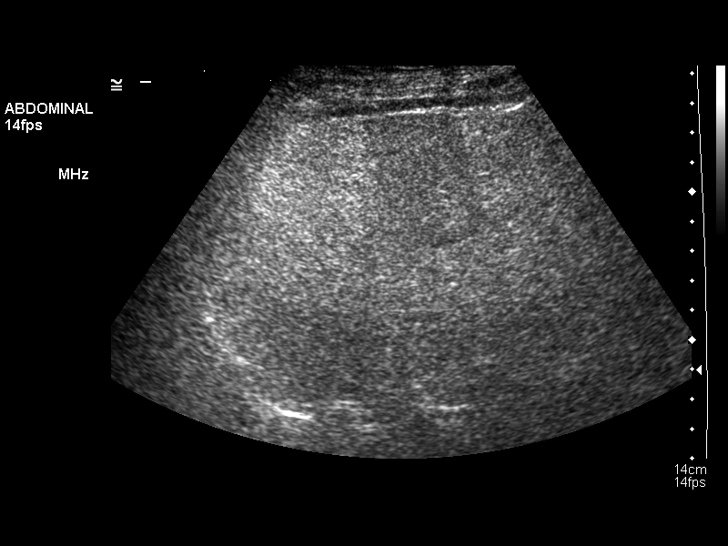
[im 37/88]
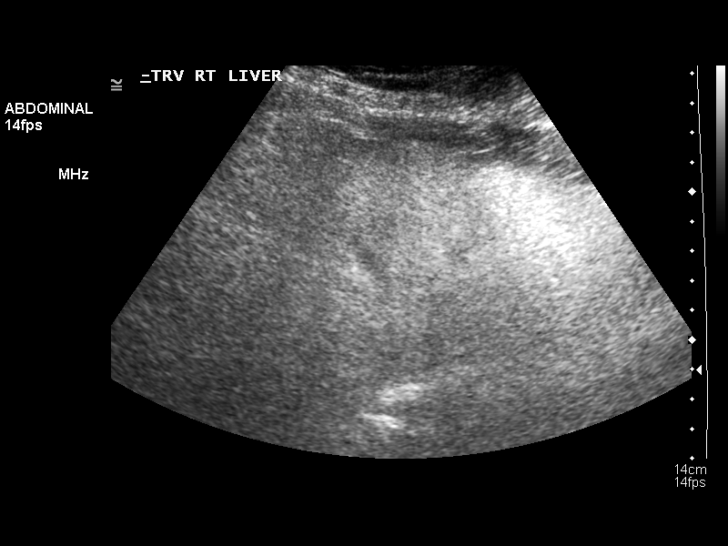
[im 44/88]
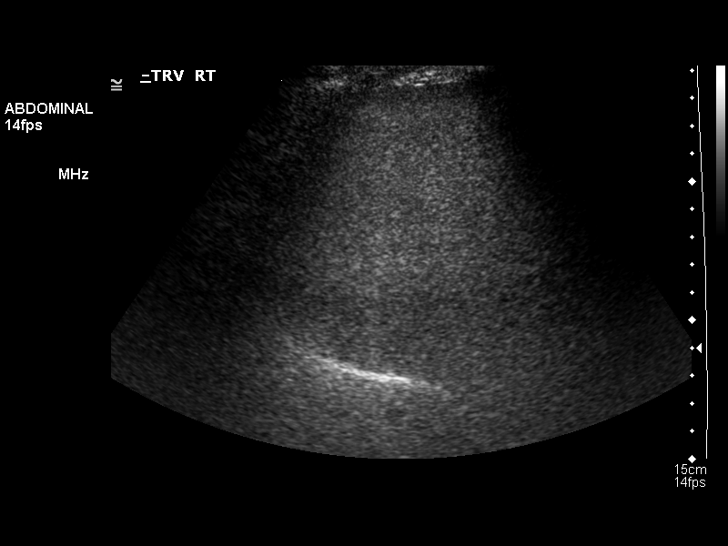
[im 51/88]
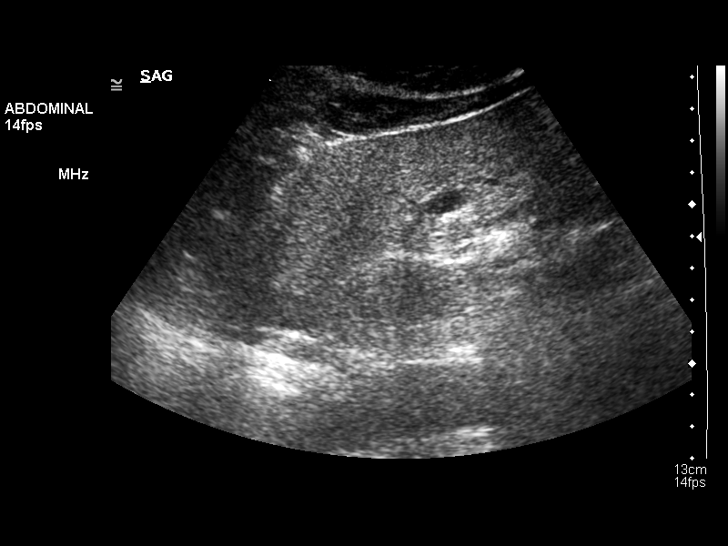
[im 59/88]
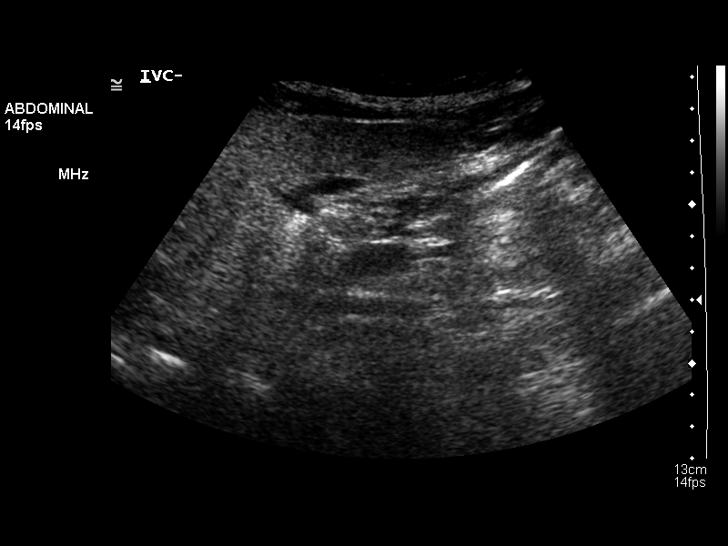
[im 66/88]
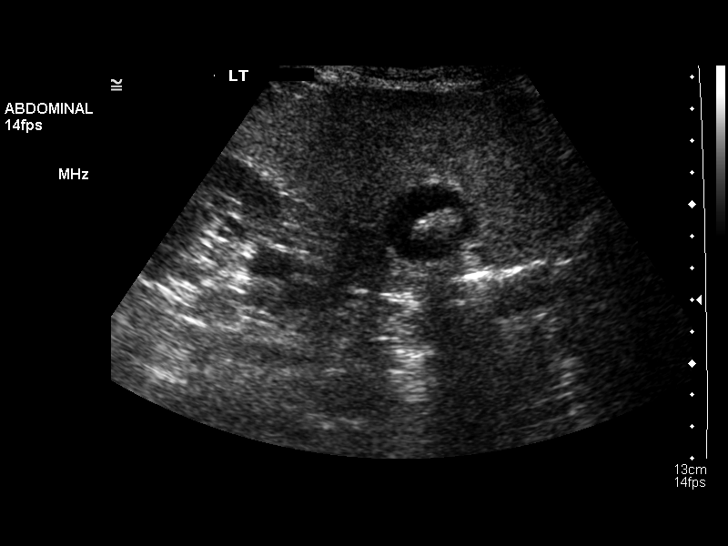
[im 73/88]
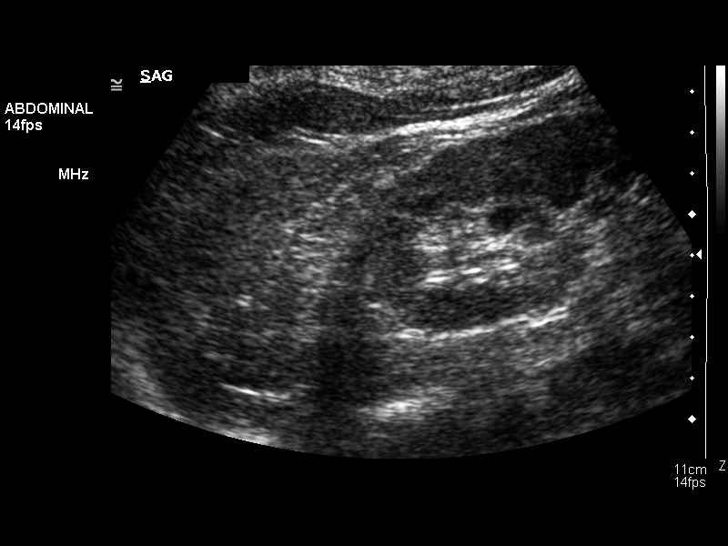
[im 80/88]
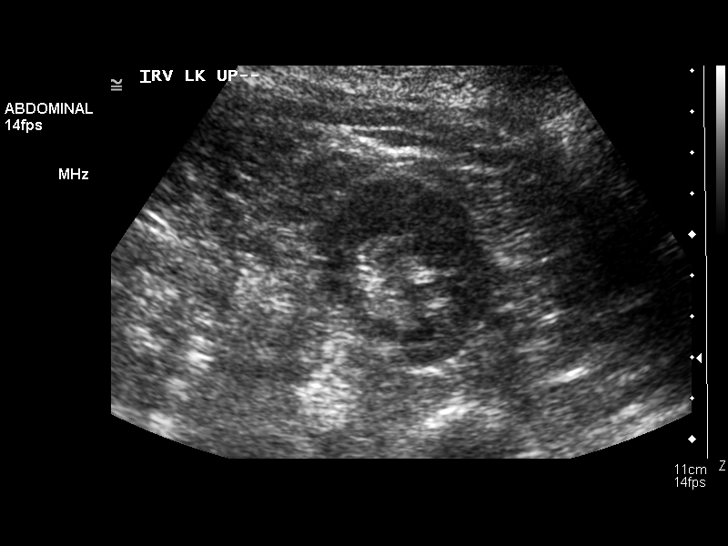
[im 88/88]
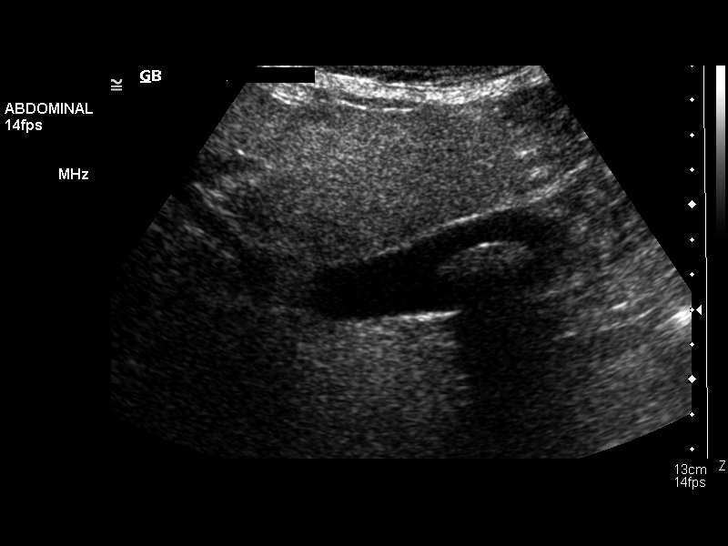

[13 of 25 positions shown; findings below may reference images not displayed]

FINDINGS: Gallbladder

There is cholelithiasis. A single mobile calculus is seen within the
gallbladder. The calculus has a greatest diameter of 3.4 cm.
Gallbladder wall is not thickened measuring 2.4 mm. No
pericholecystic fluid collection is evident. Negative sonographic
Murphy's sign.

Common bile duct

Diameter: Common bile duct measured 3.8 mm. No choledocholithiasis
is evident.

Liver

There is increased echogenicity of the hepatic parenchymal
echotexture. Parenchyma is somewhat inhomogeneous but no discrete
mass is identified. Portal vein is patent with hepatopetal flow.

IVC

No abnormality visualized.

Pancreas

Visualized portion unremarkable. Detail is some portions are
obscured by bowel gas.

Spleen

Splenic length is 6.9 cm. No focal abnormality is seen.

Right Kidney

Length: Right renal length is 10.5 cm. Echogenicity within normal
limits. There is a lower pole cyst measuring 2.3 x 1.9 x 1.8 cm. The
cyst appears simple.

Left Kidney

Length: The left renal length is 0.6 cm. Echogenicity within normal
limits. I will see there is a lower pole cyst measuring 1.0 x 0.8 x
1.1 cm. The cyst appears simple.

Abdominal aorta

No aneurysm visualized. Maximal aortic diameter is 2.6 cm. There is
some atherosclerotic plaquing.
IMPRESSION: There is cholelithiasis. There is subtle mobile calculus is seen
within the gallbladder. There is no ultrasound evidence of
cholecystitis. Bowel loops appear normal. There is increased
echogenicity of the hepatic parenchymal echotexture. Most commonly
this is associated with hepatic steatosis but is nonspecific 4. No
mass is evident. Bilateral renal cysts are seen and appear simple.

## 2015-04-13 IMAGING — RF DG CHOLANGIOGRAM OPERATIVE
1 series · 4 of 4 positions shown · non-contrast
Comparison: 03/02/2013

CLINICAL DATA: Cholecystectomy, gallstones

EXAM:
INTRAOPERATIVE CHOLANGIOGRAM
TECHNIQUE: Cholangiographic images from the C-arm fluoroscopic device were
submitted for interpretation post-operatively. Please see the
procedural report for the amount of contrast and the fluoroscopy
time utilized.

[Series 1: run · 4 of 140 frames shown]
[frame 3/140]
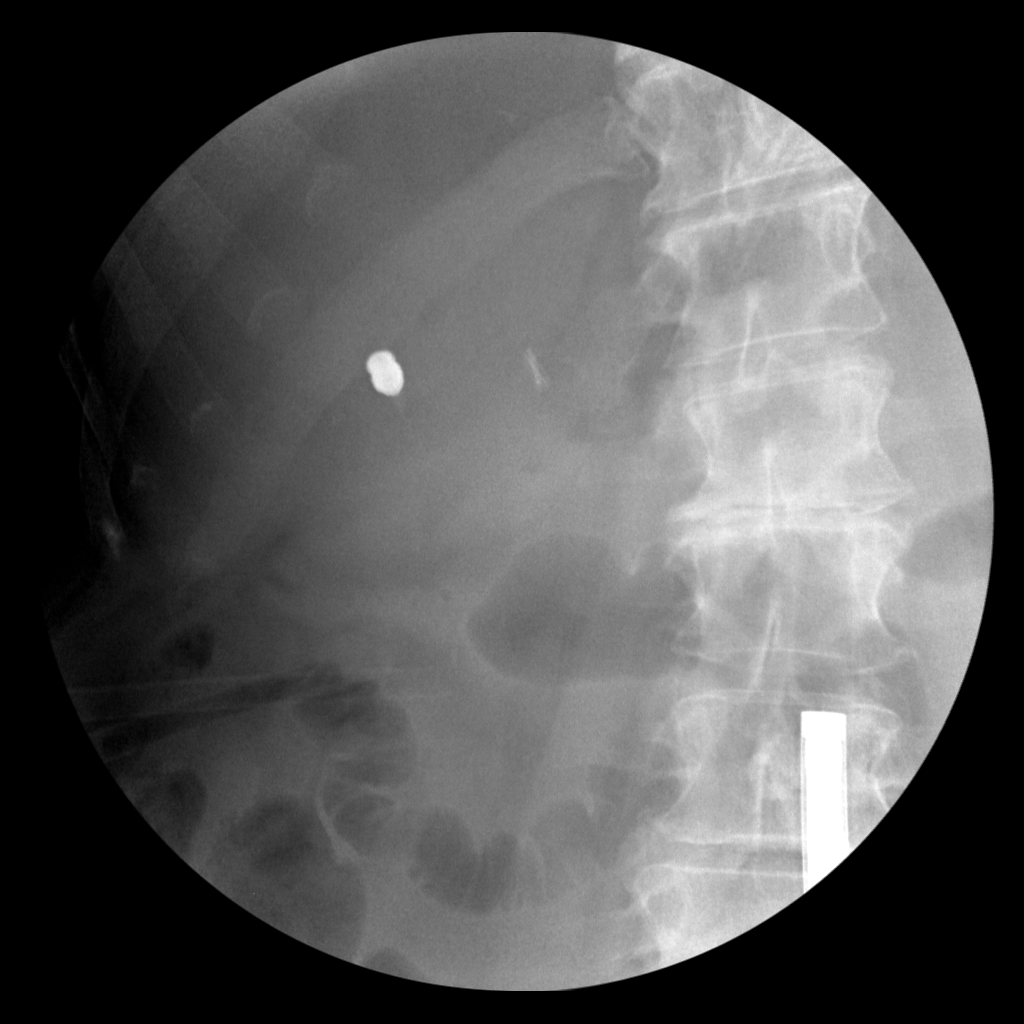
[frame 22/140]
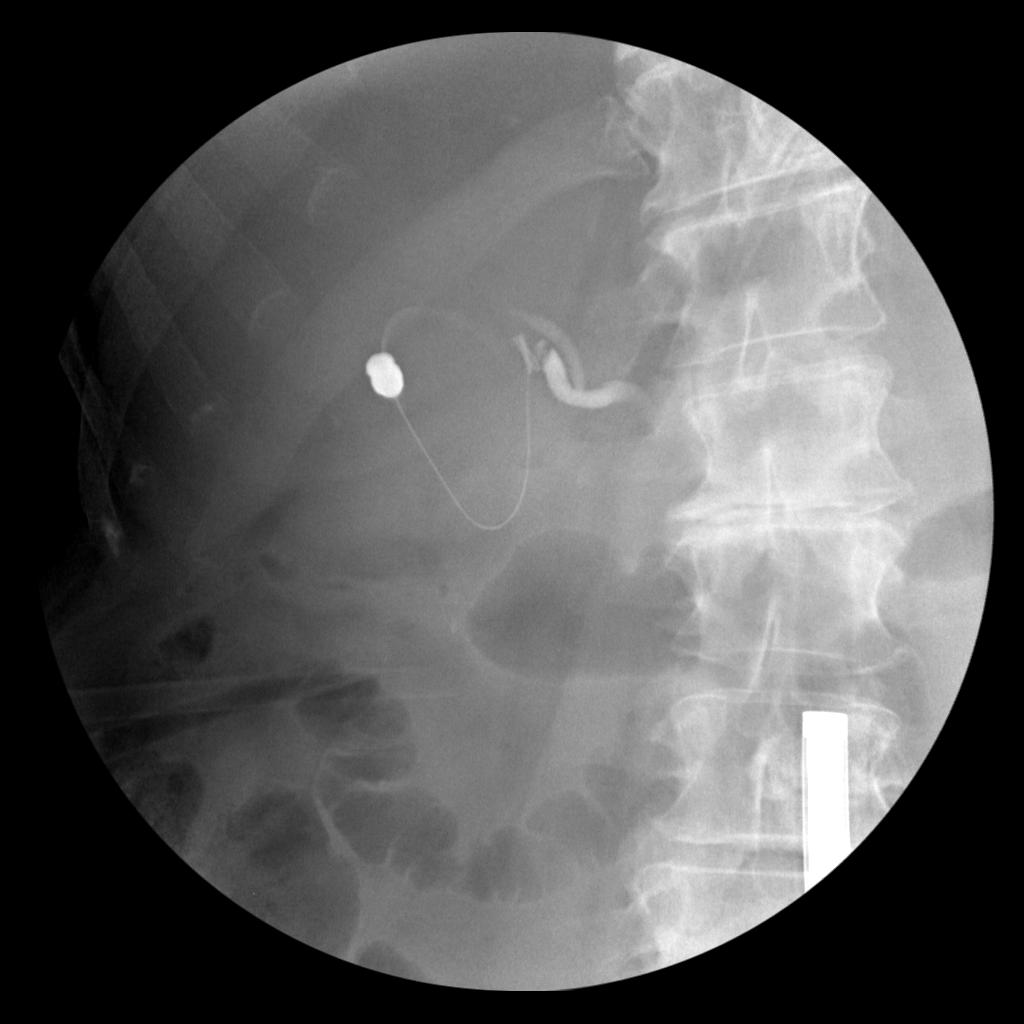
[frame 71/140]
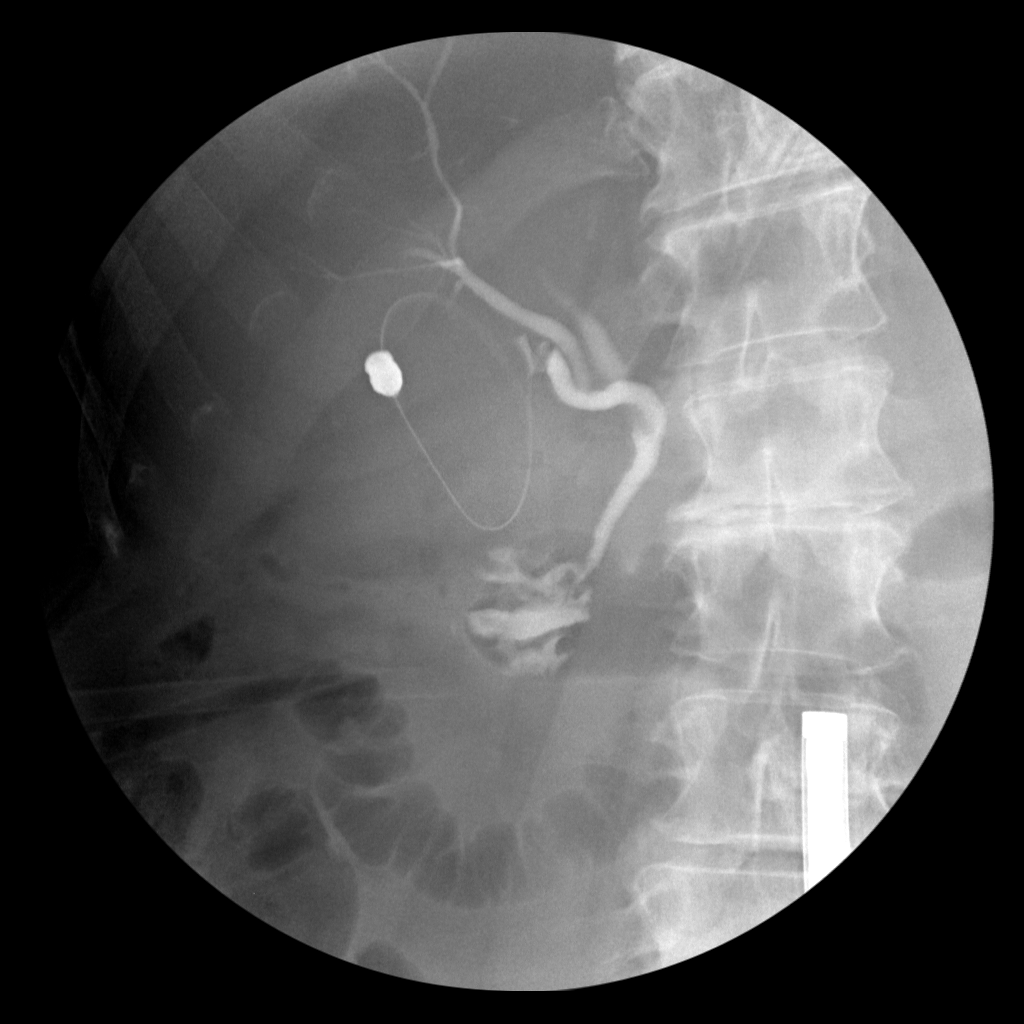
[frame 120/140]
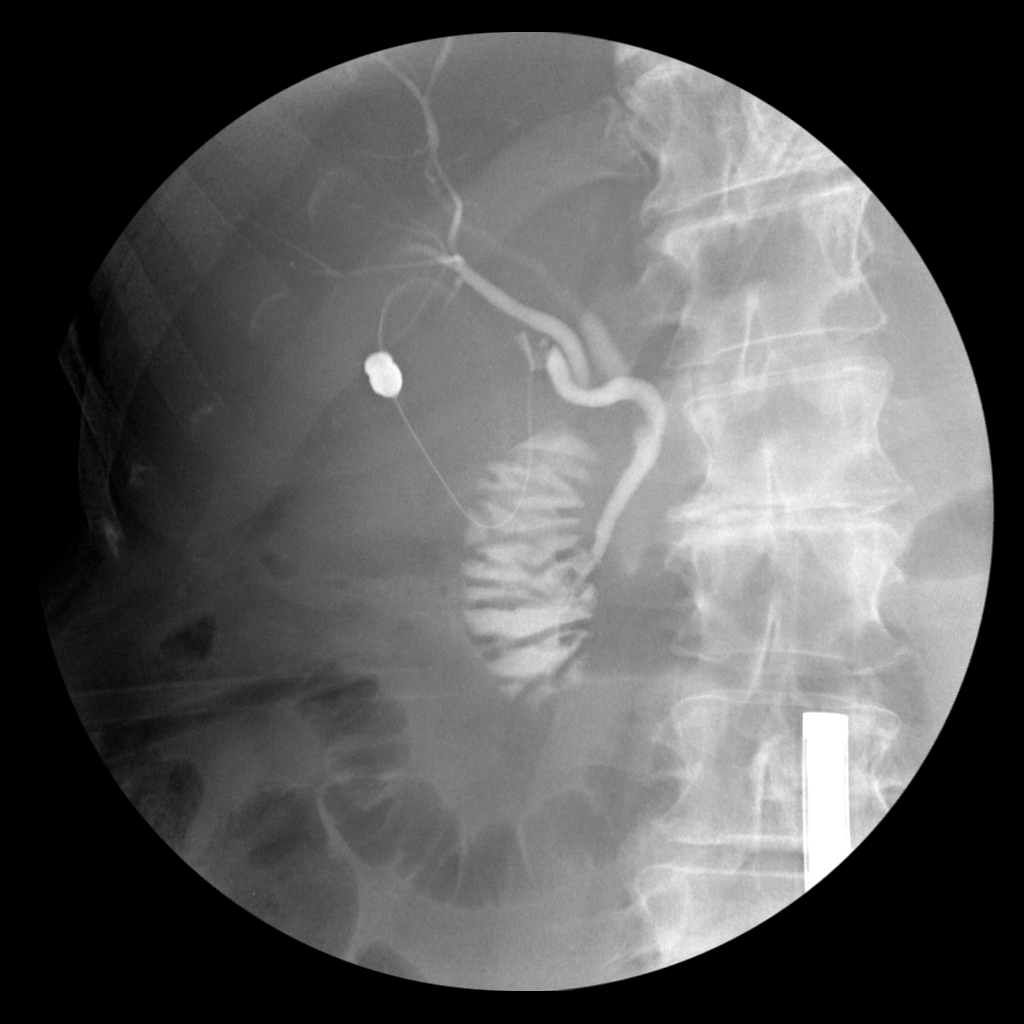

[4 of 4 positions shown; findings below may reference images not displayed]

FINDINGS: Intraoperative cholangiogram performed during the laparoscopic
cholecystectomy. Residual cystic duct, common hepatic duct,
accessory right hepatic duct, and common bile duct are patent. There
is an accessory right hepatic duct noted which joins the cystic duct
proximally. This is a normal variant. No biliary obstruction,
dilatation, or filling defect. No stone demonstrated. Contrast
opacifies the duodenum.
IMPRESSION: Patent biliary system.

No obstruction or choledocholithiasis

Accessory right hepatic duct drains into the cystic duct

## 2019-03-23 ENCOUNTER — Ambulatory Visit: Payer: Medicare Other | Admitting: Allergy and Immunology

## 2019-04-13 ENCOUNTER — Encounter: Payer: Self-pay | Admitting: Allergy and Immunology

## 2019-04-13 ENCOUNTER — Other Ambulatory Visit: Payer: Self-pay

## 2019-04-13 ENCOUNTER — Ambulatory Visit: Payer: Medicare Other | Admitting: Allergy and Immunology

## 2019-04-13 VITALS — BP 150/82 | HR 86 | Temp 97.9°F | Resp 16 | Ht 62.0 in | Wt 147.2 lb

## 2019-04-13 DIAGNOSIS — J3089 Other allergic rhinitis: Secondary | ICD-10-CM

## 2019-04-13 DIAGNOSIS — K219 Gastro-esophageal reflux disease without esophagitis: Secondary | ICD-10-CM | POA: Diagnosis not present

## 2019-04-13 DIAGNOSIS — H6981 Other specified disorders of Eustachian tube, right ear: Secondary | ICD-10-CM | POA: Diagnosis not present

## 2019-04-13 NOTE — Patient Instructions (Addendum)
  1.  Allergen avoidance measures - house dust mite  2.  Treat and prevent inflammation:   A.  OTC Nasacort-1 spray each nostril daily  3.  Treat and prevent reflux:   A.  Consolidate caffeine and chocolate consumption  B.  Continue OTC Prevacid daily  4.  If needed:   A.  Auto inflation of the ears  B.  OTC loratadine 10 mg -1-2 tablets 1 time per day  5.  Further evaluation?  6.  Return to clinic in 4 weeks or earlier if problem  7.  Obtain Covid vaccine when available

## 2019-04-13 NOTE — Progress Notes (Signed)
[place] - [place] - [place] - [place] - [place]   Dear Dr. Harrington Challenger,  Thank you for referring SABA GOMM to the [facility] on 04/13/2019.   Below is a summation of this patient's evaluation and recommendations.  Thank you for your referral. I will keep you informed about this patient's response to treatment.   If you have any questions please do not hesitate to contact me.   Sincerely,  Jiles Prows, MD Allergy / Immunology [facility]   ______________________________________________________________________    NEW PATIENT NOTE  Referring Provider: Lawerance Cruel, MD Primary Provider: Lawerance Cruel, MD Date of office visit: 04/13/2019    Subjective:   Chief Complaint:  Elizabeth Garrett (DOB: 12/01/1945) is a 73 y.o. female who presents to the clinic on 04/13/2019 with a chief complaint of Allergic Rhinitis  (having issues with her ear) .     HPI: Feven presents to this clinic in evaluation of allergic disease involving her right ear.  She has a very interesting history that started around the end of 2019 at which point in time she developed "plugging" of her right ear along with some hearing loss and she developed this "tilting" walking without any frank vertigo.  She did not have any associated tinnitus.  She was treated with a steroid shot which basically resolved her issue other than the fact that she still continued to have her "tilting" walking.  Then this issue returned in February 2020 for which she received another steroid shot and then it returned again in September 2020 and she received another steroid shot.  As well, she used Afrin in September for a few days.    At this point she has minimal fullness of her right ear but she still has the "tilting" walking issue.  In addition, she does have a issue with postnasal drip and she has some throat  clearing and she sometimes feels as though the glands in the right side of her throat is swollen.  She does not have any associated raspy voice or swallowing problems.    She does have a history of reflux that is still intermittently active with regurgitation about 1 time every 2 weeks while using OTC Prevacid.  She does drink 1 Diet Coke per day and eats chocolate on a daily basis.  In addition, she started a supplement called cholesoff 1-year ago just a few months before this reactivity of her right ear developed.    She apparently tried some Flonase in the past which did not really help this issue.  She does have a distant history of allergic rhinitis for which she was skin tested many decades ago but she does not really think that she has a very significant amount of nasal symptoms other symptoms other than some intermittent nasal congestion and intermittent sneezing and runny nose without any precipitant at this point.  Past Medical History:  Diagnosis Date   Arthritis    Chronic kidney disease    kidney stones   GERD (gastroesophageal reflux disease)    Heart murmur    Hyperlipidemia    Hypertension    PONV (postoperative nausea and vomiting)     Past Surgical History:  Procedure Laterality Date   ABDOMINAL HYSTERECTOMY     CHOLECYSTECTOMY N/A 05/03/2013   Procedure: LAPAROSCOPIC CHOLECYSTECTOMY WITH INTRAOPERATIVE CHOLANGIOGRAM;  Surgeon: Merrie Roof, MD;  Location: [facility];  Service:  General;  Laterality: N/A;   COLONOSCOPY     DILATION AND CURETTAGE OF UTERUS     TONSILLECTOMY     UPPER GI ENDOSCOPY      Allergies as of 04/13/2019      Reactions   Sulfa Antibiotics Hives      Medication List    acetaminophen 325 MG tablet Commonly known as: TYLENOL Take 325 mg by mouth every 6 (six) hours as needed for mild pain.   benazepril 40 MG tablet Commonly known as: LOTENSIN Take 40 mg by mouth daily.   BLACK COHOSH PO Take by mouth.   CENTRUM SILVER  ADULT 50+ PO Take 1 tablet by mouth daily.   cholecalciferol 1000 units tablet Commonly known as: VITAMIN D Take 1,000 Units by mouth daily.   cholestyramine light 4 g packet Commonly known as: Prevalite Take 1 packet (4 g total) by mouth 2 (two) times daily.   diclofenac 75 MG EC tablet Commonly known as: VOLTAREN Take 75 mg by mouth daily.   hydrochlorothiazide 25 MG tablet Commonly known as: HYDRODIURIL Take 25 mg by mouth daily.   hyoscyamine 0.125 MG tablet Commonly known as: LEVSIN Take 0.25 mg by mouth every 6 (six) hours as needed for cramping.   lansoprazole 15 MG capsule Commonly known as: PREVACID Take 15 mg by mouth daily at 12 noon.   nortriptyline 10 MG capsule Commonly known as: PAMELOR Take 10 mg by mouth at bedtime.   OSTEO BI-FLEX ADV DOUBLE ST PO Take 1 tablet by mouth daily.   PROBIOTIC ADVANCED PO Take by mouth.       Review of systems negative except as noted in HPI / PMHx or noted below:  Review of Systems  Constitutional: Negative.   HENT: Negative.   Eyes: Negative.   Respiratory: Negative.   Cardiovascular: Negative.   Gastrointestinal: Negative.   Genitourinary: Negative.   Musculoskeletal: Negative.   Skin: Negative.   Neurological: Negative.   Endo/Heme/Allergies: Negative.   Psychiatric/Behavioral: Negative.     History reviewed. No pertinent family history.  Social History   Socioeconomic History   Marital status: Married    Spouse name: Not on file   Number of children: Not on file   Years of education: Not on file   Highest education level: Not on file  Occupational History   Not on file  Social Needs   Financial resource strain: Not on file   Food insecurity    Worry: Not on file    Inability: Not on file   Transportation needs    Medical: Not on file    Non-medical: Not on file  Tobacco Use   Smoking status: Never Smoker   Smokeless tobacco: Never Used  Substance and Sexual Activity    Alcohol use: No   Drug use: No   Sexual activity: Not on file  Lifestyle   Physical activity    Days per week: Not on file    Minutes per session: Not on file   Stress: Not on file  Relationships   Social connections    Talks on phone: Not on file    Gets together: Not on file    Attends religious service: Not on file    Active member of club or organization: Not on file    Attends meetings of clubs or organizations: Not on file    Relationship status: Not on file   Intimate partner violence    Fear of current or ex partner: Not on  file    Emotionally abused: Not on file    Physically abused: Not on file    Forced sexual activity: Not on file  Other Topics Concern   Not on file  Social History Narrative   Not on file    Environmental and Social history  Lives in a house with a dry environment, no animals located inside the household, carpet in the bedroom, no plastic on the bed, no plastic on the pillow, no smoking ongoing with inside the household.  Objective:   Vitals:   04/13/19 0922  BP: (!) 150/82  Pulse: 86  Resp: 16  Temp: 97.9 F (36.6 C)  SpO2: 97%   Height: 5\' 2"  (157.5 cm) Weight: 147 lb 3.2 oz (66.8 kg)  Physical Exam Constitutional:      Appearance: She is not diaphoretic.  HENT:     Head: Normocephalic.     Right Ear: Tympanic membrane, ear canal and external ear normal. Tympanic membrane has normal mobility (Good pneumatic movement).     Left Ear: Tympanic membrane, ear canal and external ear normal.     Nose: Nose normal. No mucosal edema or rhinorrhea.     Mouth/Throat:     Pharynx: Uvula midline. No oropharyngeal exudate.  Eyes:     Conjunctiva/sclera: Conjunctivae normal.  Neck:     Thyroid: No thyromegaly.     Trachea: Trachea normal. No tracheal tenderness or tracheal deviation.  Cardiovascular:     Rate and Rhythm: Normal rate and regular rhythm.     Heart sounds: Normal heart sounds, S1 normal and S2 normal. No murmur.    Pulmonary:     Effort: No respiratory distress.     Breath sounds: Normal breath sounds. No stridor. No wheezing or rales.  Lymphadenopathy:     Head:     Right side of head: No tonsillar adenopathy.     Left side of head: No tonsillar adenopathy.     Cervical: No cervical adenopathy.  Skin:    Findings: No erythema or rash.     Nails: There is no clubbing.   Neurological:     Mental Status: She is alert.     Diagnostics: Allergy skin tests were performed.  She demonstrated hypersensitivity to house dust mite.  Assessment and Plan:    1. Perennial allergic rhinitis   2. ETD (Eustachian tube dysfunction), right   3. Gastroesophageal reflux disease, unspecified whether esophagitis present     1.  Allergen avoidance measures - house dust mite  2.  Treat and prevent inflammation:   A.  OTC Nasacort-1 spray each nostril daily  3.  Treat and prevent reflux:   A.  Consolidate caffeine and chocolate consumption  B.  Continue OTC Prevacid daily  4.  If needed:   A.  Auto inflation of the ears  B.  OTC loratadine 10 mg -1-2 tablets 1 time per day  5.  Further evaluation?  6.  Return to clinic in 4 weeks or earlier if problem  7.  Obtain Covid vaccine when available  Kimbery appears to have some degree of ETD and allergic rhinitis possibly driven by atopic disease and we will get her to perform allergen avoidance measures and consistently use anti-inflammatory medications.  As well, her history is quite consistent with LPR and this may also be affecting her eustachian tube function and we will treat this issue with the therapy noted above.  I will see her back in this clinic in 4 weeks for return  visit and consideration of further evaluation and treatment pending her response.  Jessica PriestEric J. Briselda Naval, MD Allergy / Immunology [facility]

## 2019-04-14 ENCOUNTER — Encounter: Payer: Self-pay | Admitting: Allergy and Immunology

## 2019-05-11 ENCOUNTER — Ambulatory Visit (INDEPENDENT_AMBULATORY_CARE_PROVIDER_SITE_OTHER): Payer: Medicare Other | Admitting: Allergy and Immunology

## 2019-05-11 ENCOUNTER — Telehealth: Payer: Self-pay

## 2019-05-11 ENCOUNTER — Encounter: Payer: Self-pay | Admitting: Allergy and Immunology

## 2019-05-11 ENCOUNTER — Other Ambulatory Visit: Payer: Self-pay

## 2019-05-11 VITALS — BP 150/70 | HR 88 | Temp 97.7°F | Resp 18 | Ht 62.0 in

## 2019-05-11 DIAGNOSIS — J3089 Other allergic rhinitis: Secondary | ICD-10-CM

## 2019-05-11 DIAGNOSIS — H6981 Other specified disorders of Eustachian tube, right ear: Secondary | ICD-10-CM

## 2019-05-11 DIAGNOSIS — Z79899 Other long term (current) drug therapy: Secondary | ICD-10-CM

## 2019-05-11 DIAGNOSIS — K219 Gastro-esophageal reflux disease without esophagitis: Secondary | ICD-10-CM

## 2019-05-11 MED ORDER — LANSOPRAZOLE 30 MG PO CPDR: 30.0000 mg | DELAYED_RELEASE_CAPSULE | Freq: Two times a day (BID) | ORAL | 1 refills | Status: DC

## 2019-05-11 NOTE — Telephone Encounter (Signed)
Referral to [facility] for LPR and EDT.

## 2019-05-11 NOTE — Progress Notes (Signed)
- [place] - [place] - [place]   Follow-up Note  Referring Provider: Daisy Garrett, Elizabeth Alan, MD Primary Provider: Daisy Garrett, Elizabeth Alan, MD Date of Office Visit: 05/11/2019  Subjective:   Elizabeth Garrett (DOB: 1945/05/29) is a 73 y.o. female who [facility] in re-evaluation of the following:  HPI: Elizabeth Garrett returns to this clinic in evaluation of allergic rhinitis and ETD and reflux.  I last saw her in this clinic during her initial evaluation of 13 April 2019.  She has noticed some improvement regarding each one of her issues but she has not noticed total elimination of any of her issues.  Her nose is somewhat better.  Her right ear sensation is somewhat decreased.  She still has throat clearing and postnasal drip and the right side of her throat still feels a little bit swollen.  She has not been having any hearing loss or "tilting" walking although she does have popping in her ear.  Her reflux is much better.  She has been consistently using anti-inflammatory agents for her airway and therapy directed against reflux.  She has eliminated caffeine and chocolate consumption.  She has performed house dust avoidance measures inside the bedroom.  Allergies as of 05/11/2019      Reactions   Sulfa Antibiotics Hives      Medication List      acetaminophen 325 MG tablet Commonly known as: TYLENOL Take 325 mg by mouth every 6 (six) hours as needed for mild pain.   benazepril 40 MG tablet Commonly known as: LOTENSIN Take 40 mg by mouth daily.   BLACK COHOSH PO Take by mouth.   CENTRUM SILVER ADULT 50+ PO Take 1 tablet by mouth daily.   cholecalciferol 1000 units tablet Commonly known as: VITAMIN D Take 1,000 Units by mouth daily.   cholestyramine 4 GM/DOSE powder Commonly known as: QUESTRAN MIX AND TAKE BY MOUTH TWICE DAILY AS DIRECTED   cholestyramine light 4 g packet Commonly known as: Prevalite Take  1 packet (4 g total) by mouth 2 (two) times daily.   diclofenac 75 MG EC tablet Commonly known as: VOLTAREN Take 75 mg by mouth daily.   hydrochlorothiazide 25 MG tablet Commonly known as: HYDRODIURIL Take 25 mg by mouth daily.   hyoscyamine 0.125 MG tablet Commonly known as: LEVSIN Take 0.25 mg by mouth every 6 (six) hours as needed for cramping.   lansoprazole 15 MG capsule Commonly known as: PREVACID Take 15 mg by mouth daily at 12 noon.   nortriptyline 10 MG capsule Commonly known as: PAMELOR Take 10 mg by mouth at bedtime.   OSTEO BI-FLEX ADV DOUBLE ST PO Take 1 tablet by mouth daily.   PROBIOTIC ADVANCED PO Take by mouth.       Past Medical History:  Diagnosis Date  . Arthritis   . Chronic kidney disease    kidney stones  . GERD (gastroesophageal reflux disease)   . Heart murmur   . Hyperlipidemia   . Hypertension   . PONV (postoperative nausea and vomiting)     Past Surgical History:  Procedure Laterality Date  . ABDOMINAL HYSTERECTOMY    . CHOLECYSTECTOMY N/A 05/03/2013   Procedure: LAPAROSCOPIC CHOLECYSTECTOMY WITH INTRAOPERATIVE CHOLANGIOGRAM;  Surgeon: Robyne AskewPaul S Toth III, MD;  Location: [facility];  Service: General;  Laterality: N/A;  . COLONOSCOPY    . DILATION AND CURETTAGE OF UTERUS    . TONSILLECTOMY    . UPPER GI ENDOSCOPY  Review of systems negative except as noted in HPI / PMHx or noted below:  Review of Systems  Constitutional: Negative.   HENT: Negative.   Eyes: Negative.   Respiratory: Negative.   Cardiovascular: Negative.   Gastrointestinal: Negative.   Genitourinary: Negative.   Musculoskeletal: Negative.   Skin: Negative.   Neurological: Negative.   Endo/Heme/Allergies: Negative.   Psychiatric/Behavioral: Negative.      Objective:   Vitals:   05/11/19 0949  BP: (!) 150/70  Pulse: 88  Resp: 18  Temp: 97.7 F (36.5 C)  SpO2: 98%   Height: 5\' 2"  (157.5 cm)      Physical Exam Constitutional:      Appearance: She  is not diaphoretic.  HENT:     Head: Normocephalic.     Right Ear: Tympanic membrane, ear canal and external ear normal.     Left Ear: Tympanic membrane, ear canal and external ear normal.     Nose: Nose normal. No mucosal edema or rhinorrhea.     Mouth/Throat:     Pharynx: Uvula midline. No oropharyngeal exudate.  Eyes:     Conjunctiva/sclera: Conjunctivae normal.  Neck:     Thyroid: No thyromegaly.     Trachea: Trachea normal. No tracheal tenderness or tracheal deviation.  Cardiovascular:     Rate and Rhythm: Normal rate and regular rhythm.     Heart sounds: Normal heart sounds, S1 normal and S2 normal. No murmur.  Pulmonary:     Effort: No respiratory distress.     Breath sounds: Normal breath sounds. No stridor. No wheezing or rales.  Lymphadenopathy:     Head:     Right side of head: No tonsillar adenopathy.     Left side of head: No tonsillar adenopathy.     Cervical: No cervical adenopathy.  Skin:    Findings: No erythema or rash.     Nails: There is no clubbing.  Neurological:     Mental Status: She is alert.     Diagnostics: none  Assessment and Plan:   1. Perennial allergic rhinitis   2. ETD (Eustachian tube dysfunction), right   3. LPRD (laryngopharyngeal reflux disease)   4. On angiotensin-converting enzyme (ACE) inhibitors     1.  Continue to allergen avoidance measures - house dust mite  2.  Continue to treat and prevent inflammation:   A.  OTC Nasacort-1 spray each nostril daily  3.  Continue to treat and prevent reflux:   A.  Consolidate caffeine and chocolate consumption  B.  Increase Prevacid 30 mg twice a day  4.  If needed:   A.  Auto inflation of the ears  B.  OTC loratadine 10 mg -1-2 tablets 1 time per day  5.  Evaluation with ENT for LPR and ETD  6.  Return to clinic in 8 weeks or earlier if problem  7.  Obtain Covid vaccine when available  8.  Benazepril?  [name] still appears to have some issues that need further treatment and  investigation.  Although she has had some improvement regarding her atopic disease and possibly her reflux induced respiratory disease in her ear issue she still has a sensation of something in her throat especially on the right side.  We will have ENT evaluate her throat and also her ear issue at some point in the near future.  I will increase her therapy for reflux assuming she still has a component of LPR and she will continue on anti-inflammatory agents for her atopic  disease as noted above.  I will regroup with her in 8 weeks and will make a determination about further evaluation or treatment at that point in time.  It should be noted that she is on an ACE inhibitor and some of her throat sensation may be precipitated by the use of this medication although certainly at this point until we have more data collected she will continue to utilize this ACE inhibitor.  Allena Katz, MD Allergy / Immunology [place]

## 2019-05-11 NOTE — Patient Instructions (Addendum)
  1.  Continue to allergen avoidance measures - house dust mite  2.  Continue to treat and prevent inflammation:   A.  OTC Nasacort-1 spray each nostril daily  3.  Continue to treat and prevent reflux:   A.  Consolidate caffeine and chocolate consumption  B.  Increase Prevacid 30 mg twice a day  4.  If needed:   A.  Auto inflation of the ears  B.  OTC loratadine 10 mg -1-2 tablets 1 time per day  5.  Evaluation with ENT for LPR and ETD  6.  Return to clinic in 8 weeks or earlier if problem  7.  Obtain Covid vaccine when available  8.  Benazepril?

## 2019-05-11 NOTE — Telephone Encounter (Signed)
Please refer patient to [facility] for LPR and EDT.

## 2019-05-12 ENCOUNTER — Encounter: Payer: Self-pay | Admitting: Allergy and Immunology

## 2019-05-14 NOTE — Telephone Encounter (Signed)
Referral has been placed to [facility].  Their office will contact the patient.   Thanks

## 2019-06-15 DIAGNOSIS — M26629 Arthralgia of temporomandibular joint, unspecified side: Secondary | ICD-10-CM | POA: Insufficient documentation

## 2019-06-15 DIAGNOSIS — H9201 Otalgia, right ear: Secondary | ICD-10-CM | POA: Insufficient documentation

## 2019-07-13 ENCOUNTER — Encounter: Payer: Self-pay | Admitting: Allergy and Immunology

## 2019-07-13 ENCOUNTER — Other Ambulatory Visit: Payer: Self-pay

## 2019-07-13 ENCOUNTER — Ambulatory Visit: Payer: Medicare Other | Admitting: Allergy and Immunology

## 2019-07-13 VITALS — BP 148/88 | HR 84 | Temp 97.4°F | Resp 16

## 2019-07-13 DIAGNOSIS — J3089 Other allergic rhinitis: Secondary | ICD-10-CM | POA: Diagnosis not present

## 2019-07-13 DIAGNOSIS — K219 Gastro-esophageal reflux disease without esophagitis: Secondary | ICD-10-CM

## 2019-07-13 DIAGNOSIS — H6981 Other specified disorders of Eustachian tube, right ear: Secondary | ICD-10-CM | POA: Diagnosis not present

## 2019-07-13 NOTE — Progress Notes (Signed)
[place] - [place] - [place] - [place] - [place]   Follow-up Note  Referring Provider: Daisy Floro, MD Primary Provider: Daisy Floro, MD Date of Office Visit: 07/13/2019  Subjective:   Elizabeth Garrett (DOB: December 28, 1945) is a 74 y.o. female who [facility] 07[facility] in re-evaluation of the following:  HPI: Elizabeth Garrett returns to this clinic in evaluation of allergic rhinitis and ETD and reflux.  I last saw her in his clinic on 11 May 2019.  She returns today noting that she still continues to do well regarding her nasal symptoms and her ear issue at this point in time.  She still has a little bit of congestion and a little bit of runny nose but overall is much better regarding her upper airway and the right ear sensation that she had previously is now completely gone.  She still has a little bit of throat clearing in the morning but otherwise her throat has improved.  She is very satisfied with the response she has received at this point.  She did visit with Dr. Jenne Pane, ENT, and apparently he felt that she may have had a component of TMJ and she used ibuprofen for a few weeks and used a soft meal consumption pattern for a few weeks and she is better regarding her ear.  Allergies as of 07/13/2019      Reactions   Sulfa Antibiotics Hives      Medication List      acetaminophen 325 MG tablet Commonly known as: TYLENOL Take 325 mg by mouth every 6 (six) hours as needed for mild pain.   benazepril 40 MG tablet Commonly known as: LOTENSIN Take 40 mg by mouth daily.   BLACK COHOSH PO Take by mouth.   CENTRUM SILVER ADULT 50+ PO Take 1 tablet by mouth daily.   cholecalciferol 1000 units tablet Commonly known as: VITAMIN D Take 1,000 Units by mouth daily.   cholestyramine 4 GM/DOSE powder Commonly known as: QUESTRAN MIX AND TAKE BY MOUTH TWICE DAILY AS DIRECTED   cholestyramine light 4 g packet Commonly known as:  Prevalite Take 1 packet (4 g total) by mouth 2 (two) times daily.   diclofenac 75 MG EC tablet Commonly known as: VOLTAREN Take 75 mg by mouth daily.   hydrochlorothiazide 25 MG tablet Commonly known as: HYDRODIURIL Take 25 mg by mouth daily.   hyoscyamine 0.125 MG tablet Commonly known as: LEVSIN Take 0.25 mg by mouth every 6 (six) hours as needed for cramping.   lansoprazole 30 MG capsule Commonly known as: Prevacid Take 1 capsule (30 mg total) by mouth 2 (two) times daily before a meal.   nortriptyline 10 MG capsule Commonly known as: PAMELOR Take 10 mg by mouth at bedtime.   OSTEO BI-FLEX ADV DOUBLE ST PO Take 1 tablet by mouth daily.   PROBIOTIC ADVANCED PO Take by mouth.       Past Medical History:  Diagnosis Date  . Arthritis   . Chronic kidney disease    kidney stones  . GERD (gastroesophageal reflux disease)   . Heart murmur   . Hyperlipidemia   . Hypertension   . PONV (postoperative nausea and vomiting)     Past Surgical History:  Procedure Laterality Date  . ABDOMINAL HYSTERECTOMY    . CHOLECYSTECTOMY N/A 05/03/2013   Procedure: LAPAROSCOPIC CHOLECYSTECTOMY WITH INTRAOPERATIVE CHOLANGIOGRAM;  Surgeon: Robyne Askew, MD;  Location: [facility];  Service: General;  Laterality: N/A;  .  COLONOSCOPY    . DILATION AND CURETTAGE OF UTERUS    . TONSILLECTOMY    . UPPER GI ENDOSCOPY      Review of systems negative except as noted in HPI / PMHx or noted below:  Review of Systems  Constitutional: Negative.   HENT: Negative.   Eyes: Negative.   Respiratory: Negative.   Cardiovascular: Negative.   Gastrointestinal: Negative.   Genitourinary: Negative.   Musculoskeletal: Negative.   Skin: Negative.   Neurological: Negative.   Endo/Heme/Allergies: Negative.   Psychiatric/Behavioral: Negative.      Objective:   Vitals:   07/13/19 1008  BP: (!) 148/88  Pulse: 84  Resp: 16  Temp: (!) 97.4 F (36.3 C)  SpO2: 98%          Physical  Exam Constitutional:      Appearance: She is not diaphoretic.  HENT:     Head: Normocephalic.     Right Ear: Tympanic membrane, ear canal and external ear normal.     Left Ear: Tympanic membrane, ear canal and external ear normal.     Nose: Nose normal. No mucosal edema or rhinorrhea.     Mouth/Throat:     Pharynx: Uvula midline. No oropharyngeal exudate.  Eyes:     Conjunctiva/sclera: Conjunctivae normal.  Neck:     Thyroid: No thyromegaly.     Trachea: Trachea normal. No tracheal tenderness or tracheal deviation.  Cardiovascular:     Rate and Rhythm: Normal rate and regular rhythm.     Heart sounds: Normal heart sounds, S1 normal and S2 normal. No murmur.  Pulmonary:     Effort: No respiratory distress.     Breath sounds: Normal breath sounds. No stridor. No wheezing or rales.  Lymphadenopathy:     Head:     Right side of head: No tonsillar adenopathy.     Left side of head: No tonsillar adenopathy.     Cervical: No cervical adenopathy.  Skin:    Findings: No erythema or rash.     Nails: There is no clubbing.  Neurological:     Mental Status: She is alert.     Diagnostics: none   Assessment and Plan:   1. Perennial allergic rhinitis   2. ETD (Eustachian tube dysfunction), right   3. LPRD (laryngopharyngeal reflux disease)     1.  Continue to allergen avoidance measures - house dust mite  2.  Continue to treat and prevent inflammation:   A.  OTC Nasacort-1 spray each nostril 1-2 times a Garrett  3.  Continue to treat and prevent reflux:   A.  Consolidate caffeine and chocolate consumption  B.  Prevacid 30 mg twice a Garrett  4.  If needed:   A.  Auto inflation of the ears  B.  OTC loratadine 10 mg -1-2 tablets 1 time per Garrett  5.  Obtain Covid vaccine when available  6.  Return to clinic in June 2021 or earlier if problem. Taper medications?  Elizabeth Garrett is very satisfied with the response she has received at this point in time on her current medical therapy which  includes treatment directed against respiratory tract inflammation and reflux.  I did have a talk with her today about the role of immunotherapy should she find that her treatment plan is inadequate in controlling her symptoms.  We will keep her on this plan of action for the next several months and I will see her back in this clinic in June 2021 and there may be  an opportunity to consolidate her medical treatment at that point assuming she continues to do well.  Elizabeth Katz, MD Allergy / Immunology [place]

## 2019-07-13 NOTE — Patient Instructions (Addendum)
  1.  Continue to allergen avoidance measures - house dust mite  2.  Continue to treat and prevent inflammation:   A.  OTC Nasacort-1 spray each nostril 1-2 times a day  3.  Continue to treat and prevent reflux:   A.  Consolidate caffeine and chocolate consumption  B.  Prevacid 30 mg twice a day  4.  If needed:   A.  Auto inflation of the ears  B.  OTC loratadine 10 mg -1-2 tablets 1 time per day  5.  Obtain Covid vaccine when available  6.  Return to clinic in June 2021 or earlier if problem. Taper medications?

## 2019-07-14 ENCOUNTER — Encounter: Payer: Self-pay | Admitting: Allergy and Immunology

## 2019-11-16 ENCOUNTER — Other Ambulatory Visit: Payer: Self-pay

## 2019-11-16 ENCOUNTER — Ambulatory Visit: Payer: Medicare Other | Admitting: Allergy and Immunology

## 2019-11-16 ENCOUNTER — Encounter: Payer: Self-pay | Admitting: Allergy and Immunology

## 2019-11-16 VITALS — BP 142/78 | HR 77 | Temp 97.2°F | Resp 18 | Ht 62.0 in | Wt 137.4 lb

## 2019-11-16 DIAGNOSIS — T7840XA Allergy, unspecified, initial encounter: Secondary | ICD-10-CM | POA: Diagnosis not present

## 2019-11-16 DIAGNOSIS — K219 Gastro-esophageal reflux disease without esophagitis: Secondary | ICD-10-CM

## 2019-11-16 DIAGNOSIS — H6981 Other specified disorders of Eustachian tube, right ear: Secondary | ICD-10-CM

## 2019-11-16 DIAGNOSIS — J3089 Other allergic rhinitis: Secondary | ICD-10-CM | POA: Diagnosis not present

## 2019-11-16 MED ORDER — LANSOPRAZOLE 30 MG PO CPDR: 30.0000 mg | DELAYED_RELEASE_CAPSULE | Freq: Two times a day (BID) | ORAL | 1 refills | Status: DC

## 2019-11-16 MED ORDER — FAMOTIDINE 40 MG PO TABS: 40.0000 mg | ORAL_TABLET | Freq: Every day | ORAL | 5 refills | Status: DC

## 2019-11-16 NOTE — Patient Instructions (Addendum)
  1.  Continue to allergen avoidance measures - house dust mite  2.  Continue to treat and prevent inflammation:   A.  OTC Nasacort-1 spray each nostril 1-2 times a day  3.  Continue to treat and prevent reflux:   A.  Prevacid 30 mg twice a day  B.  START famotidine 40 mg 1 tablet in evening  4.  If needed:   A.  Auto inflation of the ears  B.  OTC loratadine 10 mg -1-2 tablets 1 time per day  C. Ipratropium 0.06% - 1-2 sprays each nostril up to 4 times per day  5.  Blood - Alpha-Gal panel  6.  Further evaluation?  7. Return to clinic in 6 months or earlier if problem

## 2019-11-16 NOTE — Progress Notes (Signed)
[place] - [place] - [place] - [place] -    Follow-up Note  Referring Provider: Daisy Floro, MD Primary Provider: Daisy Floro, MD Date of Office Visit: 11/16/2019  Subjective:   Elizabeth Garrett (DOB: 1946/04/19) is a 74 y.o. female who [facility] 11[facility]/2019 in re-evaluation of the following:  HPI: Elizabeth Garrett returns to this clinic in reevaluation of allergic rhinitis and ETD and reflux.  I last saw her in this clinic on 13 July 2019.  Overall she has done relatively well regarding her ear and her nose.  She consistently uses a nasal steroid.  On occasion she will have a drip coming out of her nose.  She has been using some antihistamine and her ears have really been doing quite well.  She does not have the stuffy sensation of her ears.  What does bother her are these repetitive episodes of diarrhea and vomiting that have occurred 3 times since her last visit and can last several hours and are occasionally associated with profuse rhinorrhea and postnasal drip and throat clearing and some slight cough and no other associated systemic or constitutional symptoms and without any obvious provoking factor.  Apparently this has been a problem for the past 12 years and her gastroenterologist, Dr. Randa Evens, has given her multiple medications in an attempt to deal with this issue.  She will be visiting with her new gastroenterologist in July 2021.   Allergies as of 11/16/2019      Reactions   Sulfa Antibiotics Hives      Medication List      acetaminophen 325 MG tablet Commonly known as: TYLENOL Take 325 mg by mouth every 6 (six) hours as needed for mild pain.   benazepril 40 MG tablet Commonly known as: LOTENSIN Take 40 mg by mouth daily.   BLACK COHOSH PO Take by mouth.   CENTRUM SILVER ADULT 50+ PO Take 1 tablet by mouth daily.   cholecalciferol 1000 units tablet Commonly known as: VITAMIN D Take 1,000 Units by  mouth daily.   cholestyramine 4 GM/DOSE powder Commonly known as: QUESTRAN MIX AND TAKE BY MOUTH TWICE DAILY AS DIRECTED   cholestyramine light 4 g packet Commonly known as: Prevalite Take 1 packet (4 g total) by mouth 2 (two) times daily.   diclofenac 75 MG EC tablet Commonly known as: VOLTAREN Take 75 mg by mouth daily.   hydrochlorothiazide 25 MG tablet Commonly known as: HYDRODIURIL Take 25 mg by mouth daily.   hyoscyamine 0.125 MG tablet Commonly known as: LEVSIN Take 0.25 mg by mouth every 6 (six) hours as needed for cramping.   lansoprazole 30 MG capsule Commonly known as: Prevacid Take 1 capsule (30 mg total) by mouth 2 (two) times daily before a meal.   nortriptyline 10 MG capsule Commonly known as: PAMELOR Take 10 mg by mouth at bedtime.   OSTEO BI-FLEX ADV DOUBLE ST PO Take 1 tablet by mouth daily.   PROBIOTIC ADVANCED PO Take by mouth.   QC TUMERIC COMPLEX PO Take by mouth.       Past Medical History:  Diagnosis Date  . Arthritis   . Chronic kidney disease    kidney stones  . GERD (gastroesophageal reflux disease)   . Heart murmur   . Hyperlipidemia   . Hypertension   . PONV (postoperative nausea and vomiting)     Past Surgical History:  Procedure Laterality Date  . ABDOMINAL HYSTERECTOMY    . CHOLECYSTECTOMY  N/A 05/03/2013   Procedure: LAPAROSCOPIC CHOLECYSTECTOMY WITH INTRAOPERATIVE CHOLANGIOGRAM;  Surgeon: Merrie Roof, MD;  Location: [facility];  Service: General;  Laterality: N/A;  . COLONOSCOPY    . DILATION AND CURETTAGE OF UTERUS    . TONSILLECTOMY    . UPPER GI ENDOSCOPY      Review of systems negative except as noted in HPI / PMHx or noted below:  Review of Systems  Constitutional: Negative.   HENT: Negative.   Eyes: Negative.   Respiratory: Negative.   Cardiovascular: Negative.   Gastrointestinal: Negative.   Genitourinary: Negative.   Musculoskeletal: Negative.   Skin: Negative.   Neurological: Negative.     Endo/Heme/Allergies: Negative.   Psychiatric/Behavioral: Negative.      Objective:   Vitals:   11/16/19 0841  BP: (!) 142/78  Pulse: 77  Resp: 18  Temp: (!) 97.2 F (36.2 C)  SpO2: 98%   Height: 5\' 2"  (157.5 cm)  Weight: 137 lb 6.4 oz (62.3 kg)   Physical Exam Constitutional:      Appearance: She is not diaphoretic.  HENT:     Head: Normocephalic.     Right Ear: Tympanic membrane, ear canal and external ear normal.     Left Ear: Tympanic membrane, ear canal and external ear normal.     Nose: Nose normal. No mucosal edema or rhinorrhea.     Mouth/Throat:     Pharynx: Uvula midline. No oropharyngeal exudate.  Eyes:     Conjunctiva/sclera: Conjunctivae normal.  Neck:     Thyroid: No thyromegaly.     Trachea: Trachea normal. No tracheal tenderness or tracheal deviation.  Cardiovascular:     Rate and Rhythm: Normal rate and regular rhythm.     Heart sounds: Normal heart sounds, S1 normal and S2 normal. No murmur heard.   Pulmonary:     Effort: No respiratory distress.     Breath sounds: Normal breath sounds. No stridor. No wheezing or rales.  Lymphadenopathy:     Head:     Right side of head: No tonsillar adenopathy.     Left side of head: No tonsillar adenopathy.     Cervical: No cervical adenopathy.  Skin:    Findings: No erythema or rash.     Nails: There is no clubbing.  Neurological:     Mental Status: She is alert.     Diagnostics: none  Assessment and Plan:   1. Allergic reaction, initial encounter   2. Perennial allergic rhinitis   3. ETD (Eustachian tube dysfunction), right   4. LPRD (laryngopharyngeal reflux disease)     1.  Continue to allergen avoidance measures - house dust mite  2.  Continue to treat and prevent inflammation:   A.  OTC Nasacort-1 spray each nostril 1-2 times a day  3.  Continue to treat and prevent reflux:   A.  Prevacid 30 mg twice a day  B.  START famotidine 40 mg 1 tablet in evening  4.  If needed:   A.  Auto  inflation of the ears  B.  OTC loratadine 10 mg -1-2 tablets 1 time per day  C.  Ipratropium 0.06% - 1-2 sprays each nostril up to 4 times per day  5.  Blood - Alpha-Gal panel  6.  Further evaluation?  7. Return to clinic in 6 months or earlier if problem  I think that Elizabeth Garrett still has some issues with LPR and we will treat her with the addition of an H2 receptor  blocker to her proton pump inhibitor.  She has already eliminated all caffeine and chocolate consumption.  She will continue to use anti-inflammatory agents for her upper airway and ear issue.  She can use nasal ipratropium should she develop problems with rhinorrhea.  Concerning her episodes of diarrhea and vomiting and profuse rhinorrhea and some slight cough that appears to occur with a frequency of about every month or every other month, we will investigate for the possibility of alpha gal syndrome giving rise to a systemic condition affecting multiple organs.  She certainly needs to follow-up with her gastroenterologist regarding further evaluation for this issue as well.  Laurette Schimke, MD Allergy / Immunology [facility]

## 2019-11-17 ENCOUNTER — Encounter: Payer: Self-pay | Admitting: Allergy and Immunology

## 2019-11-22 LAB — ALPHA-GAL PANEL
Alpha Gal IgE*: <0.1 kU/L (ref <0.10)
Beef (Bos spp) IgE: <0.1 kU/L (ref <0.35)
Class Interpretation: 0
Class Interpretation: 0
Class Interpretation: 0
Lamb/Mutton (Ovis spp) IgE: <0.1 kU/L (ref <0.35)
Pork (Sus spp) IgE: <0.1 kU/L (ref <0.35)

## 2019-11-23 ENCOUNTER — Telehealth: Payer: Self-pay | Admitting: *Deleted

## 2019-11-23 NOTE — Progress Notes (Signed)
Informed of results.  

## 2019-11-23 NOTE — Telephone Encounter (Signed)
Alpha Gal Lab Results 11/16/2019:  Dr. Lucie Leather: Please inform patient that blood test returned without any evidence of allergy to meat products which could have been one of the issues giving rise to her recurrent GI issue. We will discuss further with RV.    Informed patient of results.

## 2020-05-15 ENCOUNTER — Telehealth: Payer: Self-pay

## 2020-05-15 MED ORDER — FAMOTIDINE 40 MG PO TABS: 40.0000 mg | ORAL_TABLET | Freq: Every day | ORAL | 1 refills | Status: DC

## 2020-05-15 MED ORDER — LANSOPRAZOLE 30 MG PO CPDR: 30.0000 mg | DELAYED_RELEASE_CAPSULE | Freq: Two times a day (BID) | ORAL | 1 refills | Status: DC

## 2020-05-15 NOTE — Telephone Encounter (Signed)
Refills have been sent to requested pharmacy. Patient made aware.

## 2020-05-15 NOTE — Telephone Encounter (Signed)
Patient called for refills on her Pepcid & Prevacid.  She is scheduled for a follow up on next week with Dr Lucie Leather.  [facility] [id number]

## 2020-05-23 ENCOUNTER — Encounter: Payer: Self-pay | Admitting: Allergy and Immunology

## 2020-05-23 ENCOUNTER — Other Ambulatory Visit: Payer: Self-pay

## 2020-05-23 ENCOUNTER — Ambulatory Visit: Payer: Medicare Other | Admitting: Allergy and Immunology

## 2020-05-23 VITALS — BP 120/80 | HR 81 | Temp 97.6°F | Resp 16 | Ht 62.75 in | Wt 140.4 lb

## 2020-05-23 DIAGNOSIS — K219 Gastro-esophageal reflux disease without esophagitis: Secondary | ICD-10-CM

## 2020-05-23 DIAGNOSIS — J3089 Other allergic rhinitis: Secondary | ICD-10-CM

## 2020-05-23 DIAGNOSIS — H6981 Other specified disorders of Eustachian tube, right ear: Secondary | ICD-10-CM | POA: Diagnosis not present

## 2020-05-23 NOTE — Progress Notes (Signed)
[place] - [place] - [place] - [place] - [place]   Follow-up Note  Referring Provider: Daisy Floro, MD Primary Provider: Daisy Floro, MD Date of Office Visit: 05/23/2020  Subjective:   Elizabeth Garrett (DOB: [birth year]) is a 74 y.o. female [facility] in re-evaluation of the following:  HPI: Elizabeth Garrett returns to this clinic in evaluation of allergic rhinitis and ETD and reflux.  Her last visit to this clinic was 16 November 2019.  Overall it sounds as though she has done relatively well.  Apparently she developed a "cold" that was secondary to RSV in November 2021 and did develop issues with cough and head congestion and sore throat and ear fullness but for the most part that has resolved.  Unfortunately, over the course of the past week or so she has had some mild ear fullness affecting the right side more than the left side.  Otherwise, her airway has really been doing quite well while using a nasal steroid.  Her reflux is under very good control at this point in time while utilizing a combination of Prevacid and famotidine.  She is having an upper endoscopy performed in January 2022 for this issue.   Allergies as of 05/23/2020      Reactions   Sulfa Antibiotics Hives      Medication List      acetaminophen 325 MG tablet Commonly known as: TYLENOL Take 325 mg by mouth every 6 (six) hours as needed for mild pain.   benazepril 40 MG tablet Commonly known as: LOTENSIN Take 40 mg by mouth daily.   BLACK COHOSH PO Take by mouth.   CENTRUM SILVER ADULT 50+ PO Take 1 tablet by mouth daily.   cholecalciferol 1000 units tablet Commonly known as: VITAMIN D Take 1,000 Units by mouth daily.   cholestyramine 4 GM/DOSE powder Commonly known as: QUESTRAN MIX AND TAKE BY MOUTH TWICE DAILY AS DIRECTED   cholestyramine light 4 g packet Commonly known as: Prevalite Take 1 packet (4 g total) by mouth 2 (two) times  daily.   diclofenac 75 MG EC tablet Commonly known as: VOLTAREN Take 75 mg by mouth daily.   famotidine 40 MG tablet Commonly known as: PEPCID Take 1 tablet (40 mg total) by mouth daily.   hydrochlorothiazide 25 MG tablet Commonly known as: HYDRODIURIL Take 25 mg by mouth daily.   hyoscyamine 0.125 MG tablet Commonly known as: LEVSIN Take 0.25 mg by mouth every 6 (six) hours as needed for cramping.   lansoprazole 30 MG capsule Commonly known as: Prevacid Take 1 capsule (30 mg total) by mouth 2 (two) times daily before a meal.   nortriptyline 10 MG capsule Commonly known as: PAMELOR Take 10 mg by mouth at bedtime.   OSTEO BI-FLEX ADV DOUBLE ST PO Take 1 tablet by mouth daily.   PROBIOTIC ADVANCED PO Take by mouth.   QC TUMERIC COMPLEX PO Take by mouth.       Past Medical History:  Diagnosis Date  . Arthritis   . Chronic kidney disease    kidney stones  . GERD (gastroesophageal reflux disease)   . Heart murmur   . Hyperlipidemia   . Hypertension   . PONV (postoperative nausea and vomiting)     Past Surgical History:  Procedure Laterality Date  . ABDOMINAL HYSTERECTOMY    . CHOLECYSTECTOMY N/A 05/03/2013   Procedure: LAPAROSCOPIC CHOLECYSTECTOMY WITH INTRAOPERATIVE CHOLANGIOGRAM;  Surgeon: Robyne Askew, MD;  Location: [facility];  Service: General;  Laterality: N/A;  . COLONOSCOPY    . DILATION AND CURETTAGE OF UTERUS    . TONSILLECTOMY    . UPPER GI ENDOSCOPY      Review of systems negative except as noted in HPI / PMHx or noted below:  Review of Systems  Constitutional: Negative.   HENT: Negative.   Eyes: Negative.   Respiratory: Negative.   Cardiovascular: Negative.   Gastrointestinal: Negative.   Genitourinary: Negative.   Musculoskeletal: Negative.   Skin: Negative.   Neurological: Negative.   Endo/Heme/Allergies: Negative.   Psychiatric/Behavioral: Negative.      Objective:   Vitals:   05/23/20 0859  BP: 120/80  Pulse: 81  Resp: 16   Temp: 97.6 F (36.4 C)  SpO2: 96%   Height: 5' 2.75" (159.4 cm)  Weight: 140 lb 6.4 oz (63.7 kg)   Physical Exam Constitutional:      Appearance: She is not diaphoretic.  HENT:     Head: Normocephalic.     Right Ear: Tympanic membrane, ear canal and external ear normal.     Left Ear: Tympanic membrane, ear canal and external ear normal.     Nose: Nose normal. No mucosal edema or rhinorrhea.     Mouth/Throat:     Mouth: Oropharynx is clear and moist and mucous membranes are normal.     Pharynx: Uvula midline. No oropharyngeal exudate.  Eyes:     Conjunctiva/sclera: Conjunctivae normal.  Neck:     Thyroid: No thyromegaly.     Trachea: Trachea normal. No tracheal tenderness or tracheal deviation.  Cardiovascular:     Rate and Rhythm: Normal rate and regular rhythm.     Heart sounds: Normal heart sounds, S1 normal and S2 normal. No murmur heard.   Pulmonary:     Effort: No respiratory distress.     Breath sounds: Normal breath sounds. No stridor. No wheezing or rales.  Musculoskeletal:        General: No edema.  Lymphadenopathy:     Head:     Right side of head: No tonsillar adenopathy.     Left side of head: No tonsillar adenopathy.     Cervical: No cervical adenopathy.  Skin:    Findings: No erythema or rash.     Nails: There is no clubbing.  Neurological:     Mental Status: She is alert.     Diagnostics: none  Assessment and Plan:   1. Perennial allergic rhinitis   2. ETD (Eustachian tube dysfunction), right   3. LPRD (laryngopharyngeal reflux disease)     1.  Continue to allergen avoidance measures - house dust mite  2.  Continue to treat and prevent inflammation:   A.  OTC Nasacort-1 spray each nostril 1-2 times a day  3.  Continue to treat and prevent reflux:   A.  Prevacid 30 mg twice a day  B.  Famotidine 40 mg 1 tablet in evening  4.  If needed:   A.  Auto inflation of the ears  B.  OTC loratadine 10 mg -1-2 tablets 1 time per day  5.   Return to clinic in 12 months or earlier if problem  Elizabeth Garrett appears to be doing relatively well at this point in time and we are going to continue to have her use a nasal steroid and aggressive therapy directed against her LPR.  Her eustachian tube dysfunction is a recurrent and intermittent issue and as long she does not get into  any problems with hearing loss or development of tinnitus or development of inner ear dysfunction with vertigo and she is comfortable with her current situation that I do not think there is the need for any further evaluation at this point.  I will see her back in this clinic in 1 year or earlier if there is a problem.  Laurette Schimke, MD Allergy / Immunology [facility]

## 2020-05-23 NOTE — Patient Instructions (Signed)
  1.  Continue to allergen avoidance measures - house dust mite  2.  Continue to treat and prevent inflammation:   A.  OTC Nasacort-1 spray each nostril 1-2 times a day  3.  Continue to treat and prevent reflux:   A.  Prevacid 30 mg twice a day  B.  Famotidine 40 mg 1 tablet in evening  4.  If needed:   A.  Auto inflation of the ears  B.  OTC loratadine 10 mg -1-2 tablets 1 time per day  5.  Return to clinic in 12 months or earlier if problem

## 2020-05-24 ENCOUNTER — Encounter: Payer: Self-pay | Admitting: Allergy and Immunology

## 2020-05-30 DIAGNOSIS — Z1159 Encounter for screening for other viral diseases: Secondary | ICD-10-CM | POA: Diagnosis not present

## 2020-06-02 DIAGNOSIS — K3189 Other diseases of stomach and duodenum: Secondary | ICD-10-CM | POA: Diagnosis not present

## 2020-06-02 DIAGNOSIS — K2289 Other specified disease of esophagus: Secondary | ICD-10-CM | POA: Diagnosis not present

## 2020-06-02 DIAGNOSIS — R1310 Dysphagia, unspecified: Secondary | ICD-10-CM | POA: Diagnosis not present

## 2020-06-02 DIAGNOSIS — K2930 Chronic superficial gastritis without bleeding: Secondary | ICD-10-CM | POA: Diagnosis not present

## 2020-06-02 DIAGNOSIS — K317 Polyp of stomach and duodenum: Secondary | ICD-10-CM | POA: Diagnosis not present

## 2020-06-02 DIAGNOSIS — K449 Diaphragmatic hernia without obstruction or gangrene: Secondary | ICD-10-CM | POA: Diagnosis not present

## 2020-06-07 DIAGNOSIS — K2930 Chronic superficial gastritis without bleeding: Secondary | ICD-10-CM | POA: Diagnosis not present

## 2020-06-07 DIAGNOSIS — K2289 Other specified disease of esophagus: Secondary | ICD-10-CM | POA: Diagnosis not present

## 2020-06-08 DIAGNOSIS — M1990 Unspecified osteoarthritis, unspecified site: Secondary | ICD-10-CM | POA: Diagnosis not present

## 2020-06-08 DIAGNOSIS — N183 Chronic kidney disease, stage 3 unspecified: Secondary | ICD-10-CM | POA: Diagnosis not present

## 2020-06-08 DIAGNOSIS — I10 Essential (primary) hypertension: Secondary | ICD-10-CM | POA: Diagnosis not present

## 2020-06-08 DIAGNOSIS — K219 Gastro-esophageal reflux disease without esophagitis: Secondary | ICD-10-CM | POA: Diagnosis not present

## 2020-06-08 DIAGNOSIS — E7800 Pure hypercholesterolemia, unspecified: Secondary | ICD-10-CM | POA: Diagnosis not present

## 2020-06-08 DIAGNOSIS — G43009 Migraine without aura, not intractable, without status migrainosus: Secondary | ICD-10-CM | POA: Diagnosis not present

## 2020-08-17 DIAGNOSIS — K219 Gastro-esophageal reflux disease without esophagitis: Secondary | ICD-10-CM | POA: Diagnosis not present

## 2020-08-17 DIAGNOSIS — I10 Essential (primary) hypertension: Secondary | ICD-10-CM | POA: Diagnosis not present

## 2020-08-17 DIAGNOSIS — G43009 Migraine without aura, not intractable, without status migrainosus: Secondary | ICD-10-CM | POA: Diagnosis not present

## 2020-08-17 DIAGNOSIS — E7800 Pure hypercholesterolemia, unspecified: Secondary | ICD-10-CM | POA: Diagnosis not present

## 2020-08-17 DIAGNOSIS — M1990 Unspecified osteoarthritis, unspecified site: Secondary | ICD-10-CM | POA: Diagnosis not present

## 2020-08-17 DIAGNOSIS — N183 Chronic kidney disease, stage 3 unspecified: Secondary | ICD-10-CM | POA: Diagnosis not present

## 2020-08-30 DIAGNOSIS — K219 Gastro-esophageal reflux disease without esophagitis: Secondary | ICD-10-CM | POA: Diagnosis not present

## 2020-08-30 DIAGNOSIS — E7800 Pure hypercholesterolemia, unspecified: Secondary | ICD-10-CM | POA: Diagnosis not present

## 2020-08-30 DIAGNOSIS — I10 Essential (primary) hypertension: Secondary | ICD-10-CM | POA: Diagnosis not present

## 2020-08-30 DIAGNOSIS — G43009 Migraine without aura, not intractable, without status migrainosus: Secondary | ICD-10-CM | POA: Diagnosis not present

## 2020-08-30 DIAGNOSIS — M1990 Unspecified osteoarthritis, unspecified site: Secondary | ICD-10-CM | POA: Diagnosis not present

## 2020-08-30 DIAGNOSIS — N183 Chronic kidney disease, stage 3 unspecified: Secondary | ICD-10-CM | POA: Diagnosis not present

## 2020-08-31 DIAGNOSIS — L719 Rosacea, unspecified: Secondary | ICD-10-CM | POA: Diagnosis not present

## 2020-11-14 DIAGNOSIS — Z780 Asymptomatic menopausal state: Secondary | ICD-10-CM | POA: Diagnosis not present

## 2020-11-14 DIAGNOSIS — Z1231 Encounter for screening mammogram for malignant neoplasm of breast: Secondary | ICD-10-CM | POA: Diagnosis not present

## 2020-11-15 ENCOUNTER — Telehealth: Payer: Medicare Other | Admitting: Allergy and Immunology

## 2020-11-15 ENCOUNTER — Other Ambulatory Visit: Payer: Self-pay | Admitting: *Deleted

## 2020-11-15 MED ORDER — LANSOPRAZOLE 30 MG PO CPDR: 30.0000 mg | DELAYED_RELEASE_CAPSULE | Freq: Two times a day (BID) | ORAL | 1 refills | Status: DC

## 2020-11-15 MED ORDER — FAMOTIDINE 40 MG PO TABS: 40.0000 mg | ORAL_TABLET | Freq: Every day | ORAL | 1 refills | Status: DC

## 2020-11-15 NOTE — Telephone Encounter (Signed)
Patient called and wanted to know if she should continue her acid medications, pepcid & prevacid until her appointment on 05/22/2021. If she needs to continue them she needs a refill sent to [facility] in [place] on [address].  Please advise.

## 2020-11-15 NOTE — Telephone Encounter (Signed)
Called and spoke with the patient in regards to her medications and she is still taking them with benefit. I advised that if she is still taking them with benefit then she should still continue the medication. Patient verbalized understanding. Medication refills have been sent in.

## 2020-11-20 DIAGNOSIS — N183 Chronic kidney disease, stage 3 unspecified: Secondary | ICD-10-CM | POA: Diagnosis not present

## 2020-11-20 DIAGNOSIS — K219 Gastro-esophageal reflux disease without esophagitis: Secondary | ICD-10-CM | POA: Diagnosis not present

## 2020-11-20 DIAGNOSIS — M1990 Unspecified osteoarthritis, unspecified site: Secondary | ICD-10-CM | POA: Diagnosis not present

## 2020-11-20 DIAGNOSIS — G43009 Migraine without aura, not intractable, without status migrainosus: Secondary | ICD-10-CM | POA: Diagnosis not present

## 2020-11-20 DIAGNOSIS — I10 Essential (primary) hypertension: Secondary | ICD-10-CM | POA: Diagnosis not present

## 2020-11-20 DIAGNOSIS — E7800 Pure hypercholesterolemia, unspecified: Secondary | ICD-10-CM | POA: Diagnosis not present

## 2021-02-07 DIAGNOSIS — I10 Essential (primary) hypertension: Secondary | ICD-10-CM | POA: Diagnosis not present

## 2021-02-07 DIAGNOSIS — R7309 Other abnormal glucose: Secondary | ICD-10-CM | POA: Diagnosis not present

## 2021-02-07 DIAGNOSIS — E7800 Pure hypercholesterolemia, unspecified: Secondary | ICD-10-CM | POA: Diagnosis not present

## 2021-02-14 DIAGNOSIS — L719 Rosacea, unspecified: Secondary | ICD-10-CM | POA: Diagnosis not present

## 2021-02-14 DIAGNOSIS — R7303 Prediabetes: Secondary | ICD-10-CM | POA: Diagnosis not present

## 2021-02-14 DIAGNOSIS — E7800 Pure hypercholesterolemia, unspecified: Secondary | ICD-10-CM | POA: Diagnosis not present

## 2021-02-14 DIAGNOSIS — Z0000 Encounter for general adult medical examination without abnormal findings: Secondary | ICD-10-CM | POA: Diagnosis not present

## 2021-02-14 DIAGNOSIS — Z23 Encounter for immunization: Secondary | ICD-10-CM | POA: Diagnosis not present

## 2021-02-14 DIAGNOSIS — I10 Essential (primary) hypertension: Secondary | ICD-10-CM | POA: Diagnosis not present

## 2021-02-14 DIAGNOSIS — E876 Hypokalemia: Secondary | ICD-10-CM | POA: Diagnosis not present

## 2021-03-19 DIAGNOSIS — E876 Hypokalemia: Secondary | ICD-10-CM | POA: Diagnosis not present

## 2021-04-26 DIAGNOSIS — K580 Irritable bowel syndrome with diarrhea: Secondary | ICD-10-CM | POA: Diagnosis not present

## 2021-05-16 ENCOUNTER — Other Ambulatory Visit: Payer: Self-pay | Admitting: Allergy and Immunology

## 2021-05-22 ENCOUNTER — Encounter: Payer: Self-pay | Admitting: Allergy and Immunology

## 2021-05-22 ENCOUNTER — Ambulatory Visit: Payer: Medicare Other | Admitting: Allergy and Immunology

## 2021-05-22 ENCOUNTER — Other Ambulatory Visit: Payer: Self-pay

## 2021-05-22 VITALS — BP 138/76 | HR 100 | Temp 97.2°F | Resp 20 | Ht 62.0 in | Wt 139.6 lb

## 2021-05-22 DIAGNOSIS — J3089 Other allergic rhinitis: Secondary | ICD-10-CM

## 2021-05-22 DIAGNOSIS — K219 Gastro-esophageal reflux disease without esophagitis: Secondary | ICD-10-CM | POA: Diagnosis not present

## 2021-05-22 DIAGNOSIS — H6981 Other specified disorders of Eustachian tube, right ear: Secondary | ICD-10-CM | POA: Diagnosis not present

## 2021-05-22 MED ORDER — LANSOPRAZOLE 30 MG PO CPDR: 30.0000 mg | DELAYED_RELEASE_CAPSULE | Freq: Two times a day (BID) | ORAL | 11 refills | Status: DC

## 2021-05-22 MED ORDER — LORATADINE 10 MG PO TABS: 10.0000 mg | ORAL_TABLET | Freq: Two times a day (BID) | ORAL | 11 refills | Status: DC | PRN

## 2021-05-22 MED ORDER — FAMOTIDINE 40 MG PO TABS: 40.0000 mg | ORAL_TABLET | Freq: Every evening | ORAL | 4 refills | Status: AC

## 2021-05-22 MED ORDER — TRIAMCINOLONE ACETONIDE 55 MCG/ACT NA AERO: 2.0000 | INHALATION_SPRAY | Freq: Every day | NASAL | 11 refills | Status: DC | PRN

## 2021-05-22 NOTE — Progress Notes (Signed)
[place] - [place] - [employer] - [place] - [place]   Follow-up Note  Referring Provider: Daisy Floro, MD Primary Provider: Daisy Floro, MD Date of Office Visit: 05/22/2021  Subjective:   Elizabeth Garrett (DOB: 1945-06-09) is a 75 y.o. female who [facility] 05[facility]22/2021 in re-evaluation of the following:  HPI: Elizabeth Garrett returns to this clinic in evaluation of allergic rhinitis, ETD, and reflux.  Her last visit to this clinic was 23 May 2020.  She continues to do very well regarding her nasal and ear issue on her current therapy which includes the use of Nasacort on a pretty consistent basis. She has not required a systemic steroid or antibiotic for any type of airway issue.  As well, she believes that her reflux and her throat issues are under very good control while using her current treatment which includes Prevacid twice a day and famotidine in the evening.  She has received 2 COVID vaccines and this year's flu vaccine.  Allergies as of 05/22/2021       Reactions   Sulfa Antibiotics Hives        Medication List    acetaminophen 325 MG tablet Commonly known as: TYLENOL Take 325 mg by mouth every 6 (six) hours as needed for mild pain.   ALPRAZolam 0.25 MG tablet Commonly known as: XANAX 1/2 tablet   benazepril 40 MG tablet Commonly known as: LOTENSIN Take 40 mg by mouth daily.   BLACK COHOSH PO Take by mouth.   CENTRUM SILVER ADULT 50+ PO Take 1 tablet by mouth daily.   cholecalciferol 1000 units tablet Commonly known as: VITAMIN D Take 1,000 Units by mouth daily.   Vitamin D-3 25 MCG (1000 UT) Caps 1 capsule   CHOLEST OFF PO Take by mouth QID.   cholestyramine 4 GM/DOSE powder Commonly known as: QUESTRAN MIX AND TAKE BY MOUTH TWICE DAILY AS DIRECTED   cholestyramine light 4 g packet Commonly known as: Prevalite Take 1 packet (4 g total) by mouth 2 (two) times daily.   CLARITIN PO Take by  mouth 2 (two) times daily.   diclofenac 75 MG EC tablet Commonly known as: VOLTAREN Take 75 mg by mouth daily.   DOXYCYCLINE PO Take by mouth daily.   famotidine 40 MG tablet Commonly known as: PEPCID Take 1 tablet (40 mg total) by mouth daily.   hydrochlorothiazide 25 MG tablet Commonly known as: HYDRODIURIL Take 25 mg by mouth daily.   hyoscyamine 0.125 MG tablet Commonly known as: LEVSIN Take 0.25 mg by mouth every 6 (six) hours as needed for cramping.   lansoprazole 30 MG capsule Commonly known as: PREVACID TAKE 1 CAPSULE BY MOUTH TWICE DAILY BEFORE A MEAL   metroNIDAZOLE 0.75 % cream Commonly known as: METROCREAM 1 application   NASACORT AQ NA   nortriptyline 10 MG capsule Commonly known as: PAMELOR Take 10 mg by mouth at bedtime.   Omega 3 1000 MG Caps 1 capsule   OSTEO BI-FLEX ADV DOUBLE ST PO Take 1 tablet by mouth daily.   PROBIOTIC ADVANCED PO Take by mouth.   QC TUMERIC COMPLEX PO Take by mouth.   Zinc 50 MG Tabs 1 tablet    Past Medical History:  Diagnosis Date   Arthritis    Chronic kidney disease    kidney stones   GERD (gastroesophageal reflux disease)    Heart murmur    Hyperlipidemia    Hypertension    PONV (postoperative nausea  and vomiting)     Past Surgical History:  Procedure Laterality Date   ABDOMINAL HYSTERECTOMY     CHOLECYSTECTOMY N/A 05/03/2013   Procedure: LAPAROSCOPIC CHOLECYSTECTOMY WITH INTRAOPERATIVE CHOLANGIOGRAM;  Surgeon: Robyne Askew, MD;  Location: [facility];  Service: General;  Laterality: N/A;   COLONOSCOPY     DILATION AND CURETTAGE OF UTERUS     TONSILLECTOMY     UPPER GI ENDOSCOPY      Review of systems negative except as noted in HPI / PMHx or noted below:  Review of Systems  Constitutional: Negative.   HENT: Negative.    Eyes: Negative.   Respiratory: Negative.    Cardiovascular: Negative.   Gastrointestinal: Negative.   Genitourinary: Negative.   Musculoskeletal: Negative.   Skin: Negative.    Neurological: Negative.   Endo/Heme/Allergies: Negative.   Psychiatric/Behavioral: Negative.      Objective:   Vitals:   05/22/21 1107  BP: 138/76  Pulse: 100  Resp: 20  Temp: (!) 97.2 F (36.2 C)  SpO2: 97%   Height: 5\' 2"  (157.5 cm)  Weight: 139 lb 9.6 oz (63.3 kg)   Physical Exam Constitutional:      Appearance: She is not diaphoretic.  HENT:     Head: Normocephalic.     Right Ear: Tympanic membrane, ear canal and external ear normal.     Left Ear: Tympanic membrane, ear canal and external ear normal.     Nose: Nose normal. No mucosal edema or rhinorrhea.     Mouth/Throat:     Pharynx: Uvula midline. No oropharyngeal exudate.  Eyes:     Conjunctiva/sclera: Conjunctivae normal.  Neck:     Thyroid: No thyromegaly.     Trachea: Trachea normal. No tracheal tenderness or tracheal deviation.  Cardiovascular:     Rate and Rhythm: Normal rate and regular rhythm.     Heart sounds: Normal heart sounds, S1 normal and S2 normal. No murmur heard. Pulmonary:     Effort: No respiratory distress.     Breath sounds: Normal breath sounds. No stridor. No wheezing or rales.  Lymphadenopathy:     Head:     Right side of head: No tonsillar adenopathy.     Left side of head: No tonsillar adenopathy.     Cervical: No cervical adenopathy.  Skin:    Findings: No erythema or rash.     Nails: There is no clubbing.  Neurological:     Mental Status: She is alert.    Diagnostics: none  Assessment and Plan:   1. Perennial allergic rhinitis   2. ETD (Eustachian tube dysfunction), right   3. LPRD (laryngopharyngeal reflux disease)     1.  Continue to allergen avoidance measures - house dust mite  2.  Continue to treat and prevent inflammation:   A.  OTC Nasacort-1 spray each nostril 1-2 times a day  3.  Continue to treat and prevent reflux:   A.  Prevacid 30 mg twice a day  4.  If needed:   A.  Auto inflation of the ears  B.  OTC loratadine 10 mg -1-2 tablets 1 time per  day C.  Famotidine 40 mg 1 tablet in evening  5.  Return to clinic in 12 months or earlier if problem  Jonel will continue to use Nasacort to treat her upper airway issue and her ear issue and she will continue to use a proton pump inhibitor to treat her reflux and LPR.  We will see if we can  remove the use of famotidine and just have her use this agent as needed at this point.  Assuming she does well with this plan I will see her back in his clinic in 1 year or earlier if there is a problem.  Laurette Schimke, MD Allergy / Immunology [facility]

## 2021-05-22 NOTE — Patient Instructions (Addendum)
°  1.  Continue to allergen avoidance measures - house dust mite  2.  Continue to treat and prevent inflammation:   A.  OTC Nasacort-1 spray each nostril 1-2 times a day  3.  Continue to treat and prevent reflux:   A.  Prevacid 30 mg twice a day  4.  If needed:   A.  Auto inflation of the ears  B.  OTC loratadine 10 mg -1-2 tablets 1 time per day C.  Famotidine 40 mg 1 tablet in evening  5.  Return to clinic in 12 months or earlier if problem

## 2021-05-23 ENCOUNTER — Encounter: Payer: Self-pay | Admitting: Allergy and Immunology

## 2021-07-06 DIAGNOSIS — E7800 Pure hypercholesterolemia, unspecified: Secondary | ICD-10-CM | POA: Diagnosis not present

## 2021-07-06 DIAGNOSIS — K219 Gastro-esophageal reflux disease without esophagitis: Secondary | ICD-10-CM | POA: Diagnosis not present

## 2021-07-06 DIAGNOSIS — I10 Essential (primary) hypertension: Secondary | ICD-10-CM | POA: Diagnosis not present

## 2021-11-20 DIAGNOSIS — Z1231 Encounter for screening mammogram for malignant neoplasm of breast: Secondary | ICD-10-CM | POA: Diagnosis not present

## 2022-02-21 DIAGNOSIS — I10 Essential (primary) hypertension: Secondary | ICD-10-CM | POA: Diagnosis not present

## 2022-02-21 DIAGNOSIS — E7800 Pure hypercholesterolemia, unspecified: Secondary | ICD-10-CM | POA: Diagnosis not present

## 2022-02-21 DIAGNOSIS — R7303 Prediabetes: Secondary | ICD-10-CM | POA: Diagnosis not present

## 2022-04-12 DIAGNOSIS — R21 Rash and other nonspecific skin eruption: Secondary | ICD-10-CM | POA: Diagnosis not present

## 2022-04-12 DIAGNOSIS — Z23 Encounter for immunization: Secondary | ICD-10-CM | POA: Diagnosis not present

## 2022-04-12 DIAGNOSIS — Z6823 Body mass index (BMI) 23.0-23.9, adult: Secondary | ICD-10-CM | POA: Diagnosis not present

## 2022-04-12 DIAGNOSIS — I10 Essential (primary) hypertension: Secondary | ICD-10-CM | POA: Diagnosis not present

## 2022-04-15 DIAGNOSIS — Z0000 Encounter for general adult medical examination without abnormal findings: Secondary | ICD-10-CM | POA: Diagnosis not present

## 2022-04-15 DIAGNOSIS — Z1159 Encounter for screening for other viral diseases: Secondary | ICD-10-CM | POA: Diagnosis not present

## 2022-04-15 DIAGNOSIS — E7800 Pure hypercholesterolemia, unspecified: Secondary | ICD-10-CM | POA: Diagnosis not present

## 2022-04-15 DIAGNOSIS — R7303 Prediabetes: Secondary | ICD-10-CM | POA: Diagnosis not present

## 2022-04-15 DIAGNOSIS — I10 Essential (primary) hypertension: Secondary | ICD-10-CM | POA: Diagnosis not present

## 2022-04-22 DIAGNOSIS — L270 Generalized skin eruption due to drugs and medicaments taken internally: Secondary | ICD-10-CM | POA: Diagnosis not present

## 2022-04-22 DIAGNOSIS — R21 Rash and other nonspecific skin eruption: Secondary | ICD-10-CM | POA: Diagnosis not present

## 2022-04-25 DIAGNOSIS — R051 Acute cough: Secondary | ICD-10-CM | POA: Diagnosis not present

## 2022-04-25 DIAGNOSIS — Z6824 Body mass index (BMI) 24.0-24.9, adult: Secondary | ICD-10-CM | POA: Diagnosis not present

## 2022-04-25 DIAGNOSIS — J069 Acute upper respiratory infection, unspecified: Secondary | ICD-10-CM | POA: Diagnosis not present

## 2022-06-11 ENCOUNTER — Other Ambulatory Visit: Payer: Self-pay | Admitting: Allergy and Immunology

## 2022-07-23 ENCOUNTER — Encounter: Payer: Self-pay | Admitting: Allergy and Immunology

## 2022-07-23 ENCOUNTER — Ambulatory Visit: Payer: Medicare Other | Admitting: Allergy and Immunology

## 2022-07-23 ENCOUNTER — Other Ambulatory Visit: Payer: Self-pay

## 2022-07-23 VITALS — BP 130/82 | HR 76 | Temp 97.6°F | Resp 16 | Ht 61.0 in | Wt 134.7 lb

## 2022-07-23 DIAGNOSIS — J3089 Other allergic rhinitis: Secondary | ICD-10-CM

## 2022-07-23 DIAGNOSIS — K219 Gastro-esophageal reflux disease without esophagitis: Secondary | ICD-10-CM | POA: Diagnosis not present

## 2022-07-23 DIAGNOSIS — H6991 Unspecified Eustachian tube disorder, right ear: Secondary | ICD-10-CM | POA: Diagnosis not present

## 2022-07-23 MED ORDER — TRIAMCINOLONE ACETONIDE 55 MCG/ACT NA AERO: 2.0000 | INHALATION_SPRAY | Freq: Two times a day (BID) | NASAL | 11 refills | Status: AC

## 2022-07-23 MED ORDER — LORATADINE 10 MG PO TABS: 20.0000 mg | ORAL_TABLET | Freq: Two times a day (BID) | ORAL | 11 refills | Status: DC | PRN

## 2022-07-23 MED ORDER — LANSOPRAZOLE 30 MG PO CPDR: 30.0000 mg | DELAYED_RELEASE_CAPSULE | Freq: Two times a day (BID) | ORAL | 11 refills | Status: DC

## 2022-07-23 NOTE — Progress Notes (Signed)
[place] - [place] - [place] - [place] - [place]   Follow-up Note  Referring Provider: Daisy Floro, MD Primary Provider: Daisy Floro, MD Date of Office Visit: 07/23/2022  Subjective:   Elizabeth Garrett (DOB: 09/15/1945) is a 77 y.o. female who [facility] on 07/23/2022 in re-evaluation of the following:  HPI: Elizabeth Garrett returns to this clinic in evaluation of allergic rhinitis, ETD, and reflux.  I last saw Elizabeth Garrett in this clinic 22 May 2021.  Elizabeth Garrett reflux has been under excellent control with the use of Prevacid twice a day.  Elizabeth Garrett no longer needs to use any famotidine.  Elizabeth Garrett nose is doing relatively well but Elizabeth Garrett still has a lot of problems with Elizabeth Garrett ears.  Elizabeth Garrett still has some popping and some occasional ringing.  Elizabeth Garrett is not using a nasal steroid.  Elizabeth Garrett does take Elizabeth Garrett loratadine consistently.  Elizabeth Garrett has obtained a flu vaccine.  Allergies as of 07/23/2022       Reactions   Sulfa Antibiotics Hives        Medication List    acetaminophen 325 MG tablet Commonly known as: TYLENOL Take 325 mg by mouth every 6 (six) hours as needed for mild pain.   ALPRAZolam 0.25 MG tablet Commonly known as: XANAX 1/2 tablet   benazepril 40 MG tablet Commonly known as: LOTENSIN Take 40 mg by mouth daily.   BLACK COHOSH PO Take by mouth.   CENTRUM SILVER ADULT 50+ PO Take 1 tablet by mouth daily.   cholecalciferol 1000 units tablet Commonly known as: VITAMIN D Take 1,000 Units by mouth daily.   Vitamin D-3 25 MCG (1000 UT) Caps 1 capsule   CHOLEST OFF PO Take by mouth QID.   cholestyramine 4 GM/DOSE powder Commonly known as: QUESTRAN MIX AND TAKE BY MOUTH TWICE DAILY AS DIRECTED   cholestyramine light 4 g packet Commonly known as: Prevalite Take 1 packet (4 g total) by mouth 2 (two) times daily.   diclofenac 75 MG EC tablet Commonly known as: VOLTAREN Take 75 mg by mouth daily.   famotidine 40 MG tablet Commonly known as:  PEPCID Take 1 tablet (40 mg total) by mouth at bedtime.   hydrochlorothiazide 25 MG tablet Commonly known as: HYDRODIURIL Take 25 mg by mouth daily.   hyoscyamine 0.125 MG tablet Commonly known as: LEVSIN Take 0.25 mg by mouth every 6 (six) hours as needed for cramping.   lansoprazole 30 MG capsule Commonly known as: PREVACID TAKE 1 CAPSULE BY MOUTH IN THE MORNING AND AT BEDTIME   loratadine 10 MG tablet Commonly known as: Claritin Take 1 tablet (10 mg total) by mouth 2 (two) times daily as needed (Can use an extra dose during flare ups.).   Omega 3 1000 MG Caps 1 capsule   OSTEO BI-FLEX ADV DOUBLE ST PO Take 1 tablet by mouth daily.   PROBIOTIC ADVANCED PO Take by mouth.   QC TUMERIC COMPLEX PO Take by mouth.   triamcinolone 55 MCG/ACT Aero nasal inhaler Commonly known as: NASACORT Place 2 sprays into the nose daily as needed.   triamcinolone cream 0.1 % Commonly known as: KENALOG Apply 1 Application topically 2 (two) times daily.   Zinc 50 MG Tabs 1 tablet    Past Medical History:  Diagnosis Date   Arthritis    Chronic kidney disease    kidney stones   GERD (gastroesophageal reflux disease)    Heart murmur    Hyperlipidemia  Hypertension    PONV (postoperative nausea and vomiting)     Past Surgical History:  Procedure Laterality Date   ABDOMINAL HYSTERECTOMY     CHOLECYSTECTOMY N/A 05/03/2013   Procedure: LAPAROSCOPIC CHOLECYSTECTOMY WITH INTRAOPERATIVE CHOLANGIOGRAM;  Surgeon: Robyne Askew, MD;  Location: [facility] OR;  Service: General;  Laterality: N/A;   COLONOSCOPY     DILATION AND CURETTAGE OF UTERUS     TONSILLECTOMY     UPPER GI ENDOSCOPY      Review of systems negative except as noted in HPI / PMHx or noted below:  Review of Systems  Constitutional: Negative.   HENT: Negative.    Eyes: Negative.   Respiratory: Negative.    Cardiovascular: Negative.   Gastrointestinal: Negative.   Genitourinary: Negative.   Musculoskeletal: Negative.    Skin: Negative.   Neurological: Negative.   Endo/Heme/Allergies: Negative.   Psychiatric/Behavioral: Negative.       Objective:   Vitals:   07/23/22 1125  BP: 130/82  Pulse: 76  Resp: 16  Temp: 97.6 F (36.4 C)  SpO2: 97%   Height: 5\' 1"  (154.9 cm)  Weight: 134 lb 11.2 oz (61.1 kg)   Physical Exam Constitutional:      Appearance: Elizabeth Garrett is not diaphoretic.  HENT:     Head: Normocephalic.     Right Ear: Tympanic membrane, ear canal and external ear normal.     Left Ear: Tympanic membrane, ear canal and external ear normal.     Nose: Nose normal. No mucosal edema or rhinorrhea.     Mouth/Throat:     Pharynx: Uvula midline. No oropharyngeal exudate.  Eyes:     Conjunctiva/sclera: Conjunctivae normal.  Neck:     Thyroid: No thyromegaly.     Trachea: Trachea normal. No tracheal tenderness or tracheal deviation.  Cardiovascular:     Rate and Rhythm: Normal rate and regular rhythm.     Heart sounds: Normal heart sounds, S1 normal and S2 normal. No murmur heard. Pulmonary:     Effort: No respiratory distress.     Breath sounds: Normal breath sounds. No stridor. No wheezing or rales.  Lymphadenopathy:     Head:     Right side of head: No tonsillar adenopathy.     Left side of head: No tonsillar adenopathy.     Cervical: No cervical adenopathy.  Skin:    Findings: No erythema or rash.     Nails: There is no clubbing.  Neurological:     Mental Status: Elizabeth Garrett is alert.     Diagnostics: none   Assessment and Plan:   1. Perennial allergic rhinitis   2. ETD (Eustachian tube dysfunction), right   3. LPRD (laryngopharyngeal reflux disease)    1.  Continue to allergen avoidance measures - house dust mite  2.  Continue to treat and prevent inflammation:   A.  OTC Nasacort-1 spray each nostril 1-2 times a day  3.  Continue to treat and prevent reflux:   A.  Prevacid 30 mg twice a day  4.  If needed:   A.  Auto inflation of the ears  B.  OTC loratadine 10 mg -1-2  tablets 1 time per day  5.  Return to clinic in 12 months or earlier if problem  Elizabeth Garrett appears to be doing very well and we will refill Elizabeth Garrett medicines and Elizabeth Garrett can follow-up in this clinic in 1 year.  However, if Elizabeth Garrett can have Elizabeth Garrett primary care doctor refill Elizabeth Garrett medicines and there is no  need for Elizabeth Garrett to follow-up in this clinic on a regular basis and I had that discussion with Elizabeth Garrett today.  Elizabeth Schimke, MD Allergy / Immunology [facility]

## 2022-07-23 NOTE — Patient Instructions (Signed)
  1.  Continue to allergen avoidance measures - house dust mite  2.  Continue to treat and prevent inflammation:   A.  OTC Nasacort-1 spray each nostril 1-2 times a day  3.  Continue to treat and prevent reflux:   A.  Prevacid 30 mg twice a day  4.  If needed:   A.  Auto inflation of the ears  B.  OTC loratadine 10 mg -1-2 tablets 1 time per day  5.  Return to clinic in 12 months or earlier if problem

## 2022-07-24 ENCOUNTER — Encounter: Payer: Self-pay | Admitting: Allergy and Immunology

## 2022-09-12 DIAGNOSIS — K580 Irritable bowel syndrome with diarrhea: Secondary | ICD-10-CM | POA: Diagnosis not present

## 2022-09-12 DIAGNOSIS — Z800 Family history of malignant neoplasm of digestive organs: Secondary | ICD-10-CM | POA: Diagnosis not present

## 2022-11-26 DIAGNOSIS — Z1231 Encounter for screening mammogram for malignant neoplasm of breast: Secondary | ICD-10-CM | POA: Diagnosis not present

## 2023-04-15 DIAGNOSIS — Z1159 Encounter for screening for other viral diseases: Secondary | ICD-10-CM | POA: Diagnosis not present

## 2023-04-15 DIAGNOSIS — E7800 Pure hypercholesterolemia, unspecified: Secondary | ICD-10-CM | POA: Diagnosis not present

## 2023-04-15 DIAGNOSIS — I10 Essential (primary) hypertension: Secondary | ICD-10-CM | POA: Diagnosis not present

## 2023-04-22 DIAGNOSIS — I10 Essential (primary) hypertension: Secondary | ICD-10-CM | POA: Diagnosis not present

## 2023-04-22 DIAGNOSIS — E7800 Pure hypercholesterolemia, unspecified: Secondary | ICD-10-CM | POA: Diagnosis not present

## 2023-04-22 DIAGNOSIS — Z6824 Body mass index (BMI) 24.0-24.9, adult: Secondary | ICD-10-CM | POA: Diagnosis not present

## 2023-04-22 DIAGNOSIS — Z23 Encounter for immunization: Secondary | ICD-10-CM | POA: Diagnosis not present

## 2023-04-22 DIAGNOSIS — Z0000 Encounter for general adult medical examination without abnormal findings: Secondary | ICD-10-CM | POA: Diagnosis not present

## 2023-06-05 DIAGNOSIS — I10 Essential (primary) hypertension: Secondary | ICD-10-CM | POA: Diagnosis not present

## 2023-07-01 ENCOUNTER — Other Ambulatory Visit: Payer: Self-pay | Admitting: Allergy and Immunology

## 2023-08-06 DIAGNOSIS — H25813 Combined forms of age-related cataract, bilateral: Secondary | ICD-10-CM | POA: Diagnosis not present

## 2023-09-15 DIAGNOSIS — Z800 Family history of malignant neoplasm of digestive organs: Secondary | ICD-10-CM | POA: Diagnosis not present

## 2023-09-15 DIAGNOSIS — Z8719 Personal history of other diseases of the digestive system: Secondary | ICD-10-CM | POA: Diagnosis not present

## 2023-09-15 DIAGNOSIS — R197 Diarrhea, unspecified: Secondary | ICD-10-CM | POA: Diagnosis not present

## 2023-09-15 DIAGNOSIS — R1310 Dysphagia, unspecified: Secondary | ICD-10-CM | POA: Diagnosis not present

## 2023-09-16 ENCOUNTER — Other Ambulatory Visit: Payer: Self-pay

## 2023-09-16 ENCOUNTER — Encounter: Payer: Self-pay | Admitting: Allergy and Immunology

## 2023-09-16 ENCOUNTER — Ambulatory Visit: Payer: Medicare Other | Admitting: Allergy and Immunology

## 2023-09-16 VITALS — BP 122/66 | HR 78 | Temp 97.7°F | Resp 18 | Ht 61.42 in | Wt 134.2 lb

## 2023-09-16 DIAGNOSIS — H6991 Unspecified Eustachian tube disorder, right ear: Secondary | ICD-10-CM | POA: Diagnosis not present

## 2023-09-16 DIAGNOSIS — J3089 Other allergic rhinitis: Secondary | ICD-10-CM | POA: Diagnosis not present

## 2023-09-16 DIAGNOSIS — K219 Gastro-esophageal reflux disease without esophagitis: Secondary | ICD-10-CM | POA: Diagnosis not present

## 2023-09-16 MED ORDER — LORATADINE 10 MG PO TABS: 10.0000 mg | ORAL_TABLET | Freq: Two times a day (BID) | ORAL | 3 refills | Status: AC | PRN

## 2023-09-16 MED ORDER — LANSOPRAZOLE 30 MG PO CPDR: 30.0000 mg | DELAYED_RELEASE_CAPSULE | Freq: Two times a day (BID) | ORAL | 3 refills | Status: AC

## 2023-09-16 NOTE — Patient Instructions (Signed)
  1.  Continue to allergen avoidance measures - house dust mite  2.  Continue to treat and prevent inflammation:   A.  OTC Nasacort -1 spray each nostril 1-2 times a day  3.  Continue to treat and prevent reflux:   A.  Prevacid  30 mg twice a day  4.  If needed:   A.  Auto inflation of the ears  B.  OTC loratadine  10 mg -1-2 tablets 1 time per day  5.  Return to clinic in 12 months or earlier if problem  6. Influenza = Tamiflu. Covid = Paxlovid

## 2023-09-16 NOTE — Progress Notes (Unsigned)
 [place] [facility] [place] [facility] [place] - [place] [facility] [place]   Follow-up Note  Referring Provider: Jimmey Mould, MD Primary Provider: Jimmey Mould, MD Date of Office Visit: 09/16/2023  Subjective:   Elizabeth Garrett (DOB: Nov 08, 1945) is a 78 y.o. female who [facility] in re-evaluation of the following:  HPI: [name] returns to this clinic in evaluation of allergic rhinitis, ETD, reflux.  I last saw her in this clinic 23 July 2022.  She has really done well with her airway and she has done well with her reflux and had really has no problems with her nose or her throat or classic reflux symptoms while consistently using therapy directed against inflammation of her upper airway including the use of nasal steroids and consistently treating her reflux with a proton pump inhibitor.  The 1 big complaint she has is tinnitus which has been a longstanding issue for which she has seen ENT in the past.  Allergies as of 09/16/2023       Reactions   Sulfa Antibiotics Hives, Rash        Medication List    acetaminophen  325 MG tablet Commonly known as: TYLENOL  Take 325 mg by mouth every 6 (six) hours as needed for mild pain.   ALPRAZolam 0.25 MG tablet Commonly known as: XANAX 1/2 tablet   amLODipine 2.5 MG tablet Commonly known as: NORVASC Take 2.5 mg by mouth daily.   benazepril 40 MG tablet Commonly known as: LOTENSIN Take 40 mg by mouth daily.   BLACK COHOSH PO Take by mouth.   CENTRUM SILVER ADULT 50+ PO Take 1 tablet by mouth daily.   cholecalciferol 1000 units tablet Commonly known as: VITAMIN D Take 1,000 Units by mouth daily.   Vitamin D-3 25 MCG (1000 UT) Caps 1 capsule   CHOLEST OFF PO Take by mouth QID.   cholestyramine  4 GM/DOSE powder Commonly known as: QUESTRAN  MIX AND TAKE BY MOUTH TWICE DAILY AS DIRECTED   cholestyramine  light 4 g packet Commonly known as: Prevalite  Take 1 packet (4 g  total) by mouth 2 (two) times daily.   diclofenac 75 MG EC tablet Commonly known as: VOLTAREN Take 75 mg by mouth daily.   famotidine  40 MG tablet Commonly known as: PEPCID  Take 1 tablet (40 mg total) by mouth at bedtime.   hydrochlorothiazide 25 MG tablet Commonly known as: HYDRODIURIL Take 25 mg by mouth daily.   hyoscyamine 0.125 MG tablet Commonly known as: LEVSIN Take 0.25 mg by mouth every 6 (six) hours as needed for cramping.   lansoprazole  30 MG capsule Commonly known as: PREVACID  Take 1 capsule (30 mg total) by mouth 2 (two) times daily.   loratadine  10 MG tablet Commonly known as: Claritin  Take 1 tablet (10 mg total) by mouth 2 (two) times daily as needed (Can use an extra dose during flare ups.). What changed: how much to take Changed by: Kavion Mancinas J Mitsuru Dault   Omega 3 1000 MG Caps 1 capsule   OSTEO BI-FLEX ADV DOUBLE ST PO Take 1 tablet by mouth daily.   PROBIOTIC ADVANCED PO Take by mouth.   QC TUMERIC COMPLEX PO Take by mouth.   triamcinolone  55 MCG/ACT Aero nasal inhaler Commonly known as: NASACORT  Place 2 sprays into the nose 2 (two) times daily.   triamcinolone  cream 0.1 % Commonly known as: KENALOG  Apply 1 Application topically 2 (two) times daily.   Zinc 50 MG Tabs 1 tablet  Past Medical History:  Diagnosis Date   Arthritis    Chronic kidney disease    kidney stones   GERD (gastroesophageal reflux disease)    Heart murmur    Hyperlipidemia    Hypertension    PONV (postoperative nausea and vomiting)     Past Surgical History:  Procedure Laterality Date   ABDOMINAL HYSTERECTOMY     CHOLECYSTECTOMY N/A 05/03/2013   Procedure: LAPAROSCOPIC CHOLECYSTECTOMY WITH INTRAOPERATIVE CHOLANGIOGRAM;  Surgeon: Mayme Spearman, MD;  Location: [facility] OR;  Service: General;  Laterality: N/A;   COLONOSCOPY     DILATION AND CURETTAGE OF UTERUS     TONSILLECTOMY     UPPER GI ENDOSCOPY      Review of systems negative except as noted in HPI / PMHx or noted  below:  Review of Systems  Constitutional: Negative.   HENT: Negative.    Eyes: Negative.   Respiratory: Negative.    Cardiovascular: Negative.   Gastrointestinal: Negative.   Genitourinary: Negative.   Musculoskeletal: Negative.   Skin: Negative.   Neurological: Negative.   Endo/Heme/Allergies: Negative.   Psychiatric/Behavioral: Negative.       Objective:   Vitals:   09/16/23 1058  BP: 122/66  Pulse: 78  Resp: 18  Temp: 97.7 F (36.5 C)  SpO2: 95%   Height: 5' 1.42" (156 cm)  Weight: 134 lb 3.2 oz (60.9 kg)   Physical Exam Constitutional:      Appearance: She is not diaphoretic.  HENT:     Head: Normocephalic.     Right Ear: Tympanic membrane, ear canal and external ear normal.     Left Ear: Tympanic membrane, ear canal and external ear normal.     Nose: Nose normal. No mucosal edema or rhinorrhea.     Mouth/Throat:     Pharynx: Uvula midline. No oropharyngeal exudate.  Eyes:     Conjunctiva/sclera: Conjunctivae normal.  Neck:     Thyroid: No thyromegaly.     Trachea: Trachea normal. No tracheal tenderness or tracheal deviation.  Cardiovascular:     Rate and Rhythm: Normal rate and regular rhythm.     Heart sounds: Normal heart sounds, S1 normal and S2 normal. No murmur heard. Pulmonary:     Effort: No respiratory distress.     Breath sounds: Normal breath sounds. No stridor. No wheezing or rales.  Lymphadenopathy:     Head:     Right side of head: No tonsillar adenopathy.     Left side of head: No tonsillar adenopathy.     Cervical: No cervical adenopathy.  Skin:    Findings: No erythema or rash.     Nails: There is no clubbing.  Neurological:     Mental Status: She is alert.     Diagnostics: none  Assessment and Plan:   1. Perennial allergic rhinitis   2. LPRD (laryngopharyngeal reflux disease)   3. ETD (Eustachian tube dysfunction), right    1.  Continue to allergen avoidance measures - house dust mite  2.  Continue to treat and prevent  inflammation:   A.  OTC Nasacort -1 spray each nostril 1-2 times a day  3.  Continue to treat and prevent reflux:   A.  Prevacid  30 mg twice a day  4.  If needed:   A.  Auto inflation of the ears  B.  OTC loratadine  10 mg -1-2 tablets 1 time per day  5.  Return to clinic in 12 months or earlier if problem  6. Influenza =  Tamiflu. Covid = Paxlovid  Mariluz is doing pretty well with her airway issue while using a nasal steroid and she is doing pretty well with her reflux issue while using a proton pump inhibitor and she will continue on this treatment to address her upper airway inflammation and reflux induced respiratory disease.  If she has difficulty while utilizing this plan then she can contact me but otherwise we will see her back in this clinic in 1 year.  Part of her tinnitus is related to ETD and she can always perform auto inflation of her ears when necessary.  Schuyler Custard, MD Allergy  / Immunology [facility]

## 2023-09-17 ENCOUNTER — Encounter: Payer: Self-pay | Admitting: Allergy and Immunology

## 2023-10-15 DIAGNOSIS — K2970 Gastritis, unspecified, without bleeding: Secondary | ICD-10-CM | POA: Diagnosis not present

## 2023-10-15 DIAGNOSIS — Z800 Family history of malignant neoplasm of digestive organs: Secondary | ICD-10-CM | POA: Diagnosis not present

## 2023-10-15 DIAGNOSIS — Z1211 Encounter for screening for malignant neoplasm of colon: Secondary | ICD-10-CM | POA: Diagnosis not present

## 2023-10-15 DIAGNOSIS — K319 Disease of stomach and duodenum, unspecified: Secondary | ICD-10-CM | POA: Diagnosis not present

## 2023-10-15 DIAGNOSIS — K2289 Other specified disease of esophagus: Secondary | ICD-10-CM | POA: Diagnosis not present

## 2023-10-15 DIAGNOSIS — R1310 Dysphagia, unspecified: Secondary | ICD-10-CM | POA: Diagnosis not present

## 2023-10-21 DIAGNOSIS — K319 Disease of stomach and duodenum, unspecified: Secondary | ICD-10-CM | POA: Diagnosis not present

## 2023-10-21 DIAGNOSIS — K2289 Other specified disease of esophagus: Secondary | ICD-10-CM | POA: Diagnosis not present

## 2023-11-27 DIAGNOSIS — Z1231 Encounter for screening mammogram for malignant neoplasm of breast: Secondary | ICD-10-CM | POA: Diagnosis not present

## 2024-09-21 ENCOUNTER — Ambulatory Visit: Admitting: Allergy and Immunology
